# Patient Record
Sex: Female | Born: 1935 | Race: Black or African American | Hispanic: No | Smoking: Never smoker
Health system: Southern US, Community
[De-identification: ages and names within clinical notes are randomized; demographics above are authoritative.]

## PROBLEM LIST (undated history)

## (undated) DIAGNOSIS — T7840XA Allergy, unspecified, initial encounter: Secondary | ICD-10-CM

## (undated) HISTORY — DX: Allergy, unspecified, initial encounter: T78.40XA

---

## 2006-06-22 ENCOUNTER — Encounter: Payer: Self-pay | Admitting: Internal Medicine

## 2006-06-27 ENCOUNTER — Encounter: Payer: Self-pay | Admitting: Internal Medicine

## 2006-07-11 ENCOUNTER — Encounter: Payer: Self-pay | Admitting: Internal Medicine

## 2007-07-03 ENCOUNTER — Encounter: Payer: Self-pay | Admitting: Internal Medicine

## 2008-08-03 ENCOUNTER — Emergency Department (HOSPITAL_COMMUNITY): Admission: EM | Admit: 2008-08-03 | Discharge: 2008-08-03 | Payer: Self-pay | Admitting: Emergency Medicine

## 2008-08-03 LAB — CONVERTED CEMR LAB
Eosinophils Relative: 7 %
HCT: 36.2 %
Hemoglobin: 12.3 g/dL
MCV: 101.1 fL
Monocytes Relative: 9 %
RBC: 3.58 M/uL

## 2008-08-19 ENCOUNTER — Ambulatory Visit: Payer: Self-pay | Admitting: Internal Medicine

## 2008-08-19 DIAGNOSIS — G43909 Migraine, unspecified, not intractable, without status migrainosus: Secondary | ICD-10-CM | POA: Insufficient documentation

## 2008-08-19 DIAGNOSIS — D61818 Other pancytopenia: Secondary | ICD-10-CM | POA: Insufficient documentation

## 2008-08-19 DIAGNOSIS — L84 Corns and callosities: Secondary | ICD-10-CM | POA: Insufficient documentation

## 2008-08-19 DIAGNOSIS — E785 Hyperlipidemia, unspecified: Secondary | ICD-10-CM

## 2008-08-19 DIAGNOSIS — Z87448 Personal history of other diseases of urinary system: Secondary | ICD-10-CM | POA: Insufficient documentation

## 2008-08-19 DIAGNOSIS — R51 Headache: Secondary | ICD-10-CM

## 2008-08-19 DIAGNOSIS — D649 Anemia, unspecified: Secondary | ICD-10-CM | POA: Insufficient documentation

## 2008-08-19 DIAGNOSIS — J309 Allergic rhinitis, unspecified: Secondary | ICD-10-CM | POA: Insufficient documentation

## 2008-08-19 DIAGNOSIS — R519 Headache, unspecified: Secondary | ICD-10-CM | POA: Insufficient documentation

## 2009-05-17 ENCOUNTER — Encounter: Payer: Self-pay | Admitting: Internal Medicine

## 2009-10-22 ENCOUNTER — Ambulatory Visit: Payer: Self-pay | Admitting: Internal Medicine

## 2009-10-22 DIAGNOSIS — R350 Frequency of micturition: Secondary | ICD-10-CM | POA: Insufficient documentation

## 2009-10-22 LAB — CONVERTED CEMR LAB
Glucose, Urine, Semiquant: NEGATIVE
Ketones, urine, test strip: NEGATIVE
Protein, U semiquant: NEGATIVE
Specific Gravity, Urine: 1.025
pH: 5

## 2010-04-25 ENCOUNTER — Emergency Department (HOSPITAL_COMMUNITY)
Admission: EM | Admit: 2010-04-25 | Discharge: 2010-04-25 | Payer: Self-pay | Source: Home / Self Care | Admitting: Emergency Medicine

## 2010-04-26 NOTE — Assessment & Plan Note (Signed)
Summary: FREQUENCY---STC   Vital Signs:  Patient profile:   77 year old female Height:      64 inches (162.56 cm) Weight:      197 pounds (89.55 kg) BMI:     33.94 O2 Sat:      97 % on Room air Temp:     98.4 degrees F (36.89 degrees C) oral Pulse rate:   63 / minute BP sitting:   110 / 60  (left arm) Cuff size:   large  Vitals Entered By: Orlan Leavens RMA (October 22, 2009 11:12 AM)  O2 Flow:  Room air CC: Urine frequency, evaluation of UTI's Is Patient Diabetic? No Pain Assessment Patient in pain? no        Primary Care Provider:  Newt Lukes MD  CC:  Urine frequency and evaluation of UTI's.  History of Present Illness:       This is a 77 year old female who presents for evaluation of UTI's.  The onset of the problem began 3 days ago.  The severity is described as moderate.  The patient is here today for evaluation and treatment of initial UTI symptoms.  The patient is not currently sexually active and is not pregnant.  Risk factors for UTI's include postmenopausal.  There is no history of calculus disease, urethral stricture, immunocompromised, and diabetes.  Prior evaluation has included no evaluation to date.  Prior treatment has included no prior treatment to date.    Clinical Review Panels:  CBC   WBC:  3.5 (08/03/2008)   RBC:  3.58 (08/03/2008)   Hgb:  12.3 (08/03/2008)   Hct:  36.2 (08/03/2008)   Platelets:  135 (08/03/2008)   MCV  101.1 (08/03/2008)   RDW  13.8 (08/03/2008)   PMN:  53 (08/03/2008)   Monos:  9 (08/03/2008)   Eosinophils:  7 (08/03/2008)   Basophil:  1 (08/03/2008)   Current Medications (verified): 1)  Multivitamins  Tabs (Multiple Vitamin) .... Take 1 By Mouth Once Daily  Allergies (verified): 1)  ! Sulfa  Past History:  Past Medical History: Allergic rhinitis mild pancytopenia- s/p heme eval 3/08: mild IgG prot band but neg BM bx/cytogenetics Anemia-NOS - gastric erosions on EGD/neg colo 4/08 Hyperlipidemia  hx H. pylori -  per gastric bx 4/08  Social History: Never Smoked widowed, single retired from [employer], former sp. ed Runner, broadcasting/film/video moved back to [place] area from [place] 02/14/2023 following spouse's death  Review of Systems  The patient denies weight loss, headaches, abdominal pain, hematuria, and incontinence.    Physical Exam  General:  overweight-appearing.  but alert, well-developed, well-nourished, and cooperative to examination.    Lungs:  normal respiratory effort, no intercostal retractions or use of accessory muscles; normal breath sounds bilaterally - no crackles and no wheezes.    Heart:  normal rate, regular rhythm, no murmur, and no rub. BLE without edema. normal DP pulses and normal cap refill in all 4 extremities    Abdomen:  soft, non-tender, normal bowel sounds, no distention; no masses and no appreciable hepatomegaly or splenomegaly.     Impression & Recommendations:  Problem # 1:  URINARY FREQUENCY (ICD-788.41)  +Udip - send for Ucx and tx 5 d cipro - Orders: UA Dipstick w/o Micro (manual) ([address]109) T-Culture, Urine ([id number]) Prescription Created Electronically [id number])  Discussed use of medication.   Complete Medication List: 1)  Multivitamins Tabs (Multiple vitamin) .... Take 1 by mouth once daily 2)  Ciprofloxacin Hcl 250 Mg Tabs (Ciprofloxacin  hcl) .... 1 by mouth two times a day  Patient Instructions: 1)  it was good to see you today. 2)  cipro for your bladder infection - your prescriptions have been electronically submitted to your pharmacy. Please take as directed. Contact our office if you believe you're having problems with the medication(s).  3)  Get plenty of rest, drink lots of clear liquids, and use Tylenol or Ibuprofen for fever and comfort. Return in 7-10 days if you're not better:sooner if you're feeling worse. 4)  Please schedule a follow-up appointment in 6 months, sooner if problems. will check labs including cholesterol at your next  visit Prescriptions: CIPROFLOXACIN HCL 250 MG TABS (CIPROFLOXACIN HCL) 1 by mouth two times a day  #10 x 0   Entered and Authorized by:   Newt Lukes MD   Signed by:   Newt Lukes MD on 10/22/2009   Method used:   Electronically to        [facility]  [address] [id number] (retail)       [address]       [place], [place]  [address]       Ph: [phone] or [phone]       Fax: [phone]   RxID:   [id number]   Laboratory Results   Urine Tests    Routine Urinalysis   Color: lt. yellow Appearance: Clear Glucose: negative   (Normal Range: Negative) Bilirubin: negative   (Normal Range: Negative) Ketone: negative   (Normal Range: Negative) Spec. Gravity: 1.025   (Normal Range: 1.003-1.035) Blood: negative   (Normal Range: Negative) pH: 5.0   (Normal Range: 5.0-8.0) Protein: negative   (Normal Range: Negative) Urobilinogen: 0.2   (Normal Range: 0-1) Nitrite: negative   (Normal Range: Negative) Leukocyte Esterace: small   (Normal Range: Negative)

## 2010-04-26 NOTE — Letter (Signed)
Summary: [facility]   Imported By: Sherian Rein 05/20/2009 13:04:53  _____________________________________________________________________  External Attachment:    Type:   Image     Comment:   External Document

## 2010-07-05 LAB — DIFFERENTIAL
Lymphocytes Relative: 31 % (ref 12–46)
Lymphs Abs: 1.1 10*3/uL (ref 0.7–4.0)
Monocytes Relative: 9 % (ref 3–12)
Neutro Abs: 1.9 10*3/uL (ref 1.7–7.7)
Neutrophils Relative %: 53 % (ref 43–77)

## 2010-07-05 LAB — CBC
RBC: 3.58 MIL/uL — ABNORMAL LOW (ref 3.87–5.11)
WBC: 3.5 10*3/uL — ABNORMAL LOW (ref 4.0–10.5)

## 2010-07-05 LAB — BASIC METABOLIC PANEL
Calcium: 9.2 mg/dL (ref 8.4–10.5)
Creatinine, Ser: 0.82 mg/dL (ref 0.4–1.2)
GFR calc Af Amer: >60 mL/min (ref >60)

## 2011-04-19 DIAGNOSIS — M129 Arthropathy, unspecified: Secondary | ICD-10-CM | POA: Insufficient documentation

## 2011-04-19 DIAGNOSIS — M6530 Trigger finger, unspecified finger: Secondary | ICD-10-CM | POA: Insufficient documentation

## 2012-12-07 ENCOUNTER — Encounter (HOSPITAL_COMMUNITY): Payer: Self-pay | Admitting: Emergency Medicine

## 2012-12-07 ENCOUNTER — Emergency Department (INDEPENDENT_AMBULATORY_CARE_PROVIDER_SITE_OTHER)
Admission: EM | Admit: 2012-12-07 | Discharge: 2012-12-07 | Disposition: A | Discharge status: Home / Self Care | Payer: Medicare Other | Source: Home / Self Care

## 2012-12-07 DIAGNOSIS — M79609 Pain in unspecified limb: Secondary | ICD-10-CM

## 2012-12-07 DIAGNOSIS — M79605 Pain in left leg: Secondary | ICD-10-CM

## 2012-12-07 NOTE — ED Provider Notes (Signed)
CSN: [id number]     Arrival date & time 12/07/12  1306 History   First MD Initiated Contact with Patient 12/07/12 1435     Chief Complaint  Patient presents with  . Leg Pain   (Consider location/radiation/quality/duration/timing/severity/associated sxs/prior Treatment) HPI  Care assumed from Gerhard Perches, NP.  Pleasant 79 year old female in generally good health that she was standing in line at the post office and experienced a sudden acute but mild pain in the left posterior calf. She was concerned about a blood clot at that time so she drove from the post office to the urgent care. By the time she arrived she was not having any more pain. Within the last 24 hours she had parked close to another car and while trying to exit the car she believes she may have strained a muscle in her lower left leg. She did not have pain at the time and did not think much else about it. Her only past medical history is that of a arthritis. No medications. No smoking or alcohol use.  History reviewed. No pertinent past medical history. History reviewed. No pertinent past surgical history. No family history on file. History  Substance Use Topics  . Smoking status: Never Smoker   . Smokeless tobacco: Not on file  . Alcohol Use: No   OB History   Grav Para Term Preterm Abortions TAB SAB Ect Mult Living                 Review of Systems Per HPI  Allergies  Sulfonamide derivatives  Home Medications  No current outpatient prescriptions on file. BP 126/46  Pulse 58  Temp(Src) 97.7 F (36.5 C) (Oral)  Resp 17  SpO2 100% Physical Exam On my exam, patient well appearing in NAD Left leg without swelling or tenderness  ED Course  Procedures (including critical care time) Labs Review Labs Reviewed  D-DIMER, QUANTITATIVE  D.dimer 0.32  Imaging Review No results found.  MDM   1. Musculoskeletal leg pain, left    79 yo F with acute onset leg pain, which quickly resolved. D.dimer was negative  for DVT. Patient reassured. - Tylenol prn for pain - Reasons to return for care (either with her PCP at [facility] or [facility]) include persistent pain, swelling, redness - She agrees to return if she worsens. D/c home in stable condition   Hilarie Fredrickson, MD 12/07/12 1546

## 2012-12-07 NOTE — ED Notes (Signed)
Leg pain that started today.  Patient reports sudden onset of pain in calf of leg.  Pain has improved since onset.  Patient reports she was standing still when pain started.

## 2012-12-07 NOTE — ED Notes (Signed)
No instructions.

## 2012-12-07 NOTE — ED Provider Notes (Signed)
CSN: [id number]     Arrival date & time 12/07/12  1306 History   First MD Initiated Contact with Patient 12/07/12 1435     No chief complaint on file.  (Consider location/radiation/quality/duration/timing/severity/associated sxs/prior Treatment) HPI Comments: Pleasant 79 year old female in generally good health that she was standing in line at the post office and experienced a sudden acute but mild pain in the left posterior calf. She was concerned about a blood clot at that time so she drove from the post office to the urgent care. By the time she arrived she was not having any more pain. Within the last 24 hours she had parked close to another car and while trying to exit the car she believes she may have strained a muscle in her lower left leg. She did not have pain at the time and did not think much else about it. Her only past medical history is that of a arthritis. No medications. No smoking or alcohol use.   No past medical history on file. No past surgical history on file. No family history on file. History  Substance Use Topics  . Smoking status: Not on file  . Smokeless tobacco: Not on file  . Alcohol Use: Not on file   OB History   No data available     Review of Systems  Constitutional: Negative for fever, chills and activity change.  HENT: Negative.   Respiratory: Negative.   Cardiovascular: Negative.   Musculoskeletal:       As per HPI  Skin: Negative for color change, pallor and rash.  Neurological: Negative.     Allergies  Sulfonamide derivatives  Home Medications  No current outpatient prescriptions on file. BP 126/46  Pulse 58  Temp(Src) 97.7 F (36.5 C) (Oral)  Resp 17  SpO2 100% Physical Exam  Nursing note and vitals reviewed. Constitutional: She is oriented to person, place, and time. She appears well-developed and well-nourished. No distress.  HENT:  Head: Normocephalic and atraumatic.  Eyes: EOM are normal. Pupils are equal, round, and reactive  to light.  Neck: Normal range of motion. Neck supple.  Musculoskeletal:  There is no discoloration, swelling, tenderness or other abnormalities of the left lower leg. She is able to bear full weight on the left leg but she does walk with a limp. Pedal pulse is one plus. Skin is slightly cool but with good color. Neuromuscular and motor sensory is intact.  Lymphadenopathy:    She has no cervical adenopathy.  Neurological: She is alert and oriented to person, place, and time. No cranial nerve deficit.  Skin: Skin is warm and dry.  Psychiatric: She has a normal mood and affect.    ED Course  Procedures (including critical care time) Labs Review Labs Reviewed - No data to display Imaging Review No results found.  MDM  No diagnosis found.   1455H pt is waiting on a d-dimer. Have reported at shift change to Dr.  Rodman Pickle who will be accepting care from this point. The pt is stable  And has no complaints at this time.    Hayden Rasmussen, NP 12/08/12 [phone]

## 2012-12-08 NOTE — ED Provider Notes (Signed)
Medical screening examination/treatment/procedure(s) were performed by a resident physician or non-physician practitioner and as the supervising physician I was immediately available for consultation/collaboration.  Clementeen Graham, MD    Rodolph Bong, MD 12/08/12 [phone]

## 2012-12-09 NOTE — ED Provider Notes (Signed)
Medical screening examination/treatment/procedure(s) were performed by a resident physician or non-physician practitioner and as the supervising physician I was immediately available for consultation/collaboration.  Zyionna Pesce, MD    Anayia Eugene S Frederic Tones, MD 12/09/12 0758 

## 2013-01-17 ENCOUNTER — Ambulatory Visit (INDEPENDENT_AMBULATORY_CARE_PROVIDER_SITE_OTHER): Payer: MEDICARE

## 2013-01-17 VITALS — BP 132/66 | HR 63 | Resp 12 | Ht 63.0 in | Wt 180.0 lb

## 2013-01-17 DIAGNOSIS — L97509 Non-pressure chronic ulcer of other part of unspecified foot with unspecified severity: Secondary | ICD-10-CM

## 2013-01-17 DIAGNOSIS — S90121A Contusion of right lesser toe(s) without damage to nail, initial encounter: Secondary | ICD-10-CM

## 2013-01-17 DIAGNOSIS — M2010 Hallux valgus (acquired), unspecified foot: Secondary | ICD-10-CM

## 2013-01-17 DIAGNOSIS — M2040 Other hammer toe(s) (acquired), unspecified foot: Secondary | ICD-10-CM

## 2013-01-17 DIAGNOSIS — L02619 Cutaneous abscess of unspecified foot: Secondary | ICD-10-CM

## 2013-01-17 DIAGNOSIS — R52 Pain, unspecified: Secondary | ICD-10-CM

## 2013-01-17 DIAGNOSIS — M2041 Other hammer toe(s) (acquired), right foot: Secondary | ICD-10-CM

## 2013-01-17 DIAGNOSIS — M2011 Hallux valgus (acquired), right foot: Secondary | ICD-10-CM

## 2013-01-17 MED ORDER — CEPHALEXIN 500 MG PO CAPS: 500.0000 mg | ORAL_CAPSULE | Freq: Three times a day (TID) | ORAL | Status: DC

## 2013-01-17 NOTE — Patient Instructions (Signed)

## 2013-01-17 NOTE — Progress Notes (Signed)
Subjective:    Patient ID: Monique Young, female    DOB: 1931-03-24, 79 y.o.   MRN: [id number]  HPI Comments: "I stumped these toes and now they are so sore"  2nd and 3rd toes right  Toe Pain  The incident occurred more than 1 week ago (approx 1 week ago). The incident occurred at home (getting into the shower). The injury mechanism was a direct blow. The pain is present in the right toes. The quality of the pain is described as aching. The pain is at a severity of 9/10. The pain is moderate. The pain has been constant since onset. Associated symptoms include an inability to bear weight. Associated symptoms comments: 3rd toe is callused at the tip. She reports no foreign bodies present. The symptoms are aggravated by movement, weight bearing and palpation. Treatments tried: soaked in warm epsom salt water. The treatment provided mild relief.   patient was last seen for the similar problem back in June does have notable bunion and hammertoe deformity and had aggravation or corn medial surface of second digit at that time. At this time indicates getting into the shower she bumped her toes and is having pain in particular second toe again. There is edema and erythema with keratosis medial surface of the second, as well as distal clavus third digit right foot.    Review of Systems  All other systems reviewed and are negative.       Objective:   Physical Exam  Constitutional: She is oriented to person, place, and time. She appears well-developed and well-nourished.  Cardiovascular:  Pulses:      Dorsalis pedis pulses are 2+ on the right side, and 2+ on the left side.       Posterior tibial pulses are 1+ on the right side, and 1+ on the left side.  Capillary fill time 3 seconds all digits. Skin temperature warm turgor normal. Mild varicosities noted bilateral. No edema noted.  Musculoskeletal:  Orthopedic biomechanical exam reveals rectus foot type with mild to moderate bunion deformity  right more so than left. There is digital contractures with adductovarus rotated third fourth and fifth second toe hammer toe is dorsally displaced and overlapping the third. The hallux is laterally displaced pushing against the second. There is keratoses in blistered lesion with erythema medial second distal IP joint.  X-rays reveal severe DJD of the rear foot midfoot significant HAV deformity sesamoid position 5 no fractures or osseous abnormality significant osteopenia noted no fractures or digital or metatarsal fractures are identified again dorsal displacement of the second toe MTP level with hammertoe contracture rigid  Neurological: She is alert and oriented to person, place, and time. She has normal strength.  Epicritic and proprioceptive sensations intact and symmetric bilateral. Normal plantar response DTRs not elicited to  Skin: Skin is warm and dry. No cyanosis. Nails show no clubbing.  Skin color pigment normal bilateral hair growth absent bilateral. There is keratosis distal clavus third digit right foot. There is also keratoses medial distal IP joint second digit right foot do irritation from the laterally deviated hallux the keratoses shows an erythematous base with a blisterlike ulceration. On debridement  Psychiatric: She has a normal mood and affect. Her behavior is normal.          Assessment & Plan:  Degenerative arthritic changes and HAV deformity and hammertoe deformity with subsequent ulceration or pre-also keratoses second digit right foot. Likely localized cellulitis no ascending cellulitis lymphangitis is identified at this time the  keratotic lesion is debrided down to a slight pinpoint serous drainage. Neosporin and Band-Aid dressing are applied. Tube foam padding is also dispensed and applied discuss the possibility of surgical intervention the future possibly a digital amputation would be the simplest and easiest procedure for patient to undergo.. At this time patient is  given a prescription for cephalexin 500 mg 3 times a day x10 days and recommended soap and water cleansing a Neosporin and Band-Aid dressing daily recheck in 2-3 weeks for followup and further discussion of all options.  Alvan Dame DPM

## 2013-02-07 ENCOUNTER — Ambulatory Visit (INDEPENDENT_AMBULATORY_CARE_PROVIDER_SITE_OTHER): Payer: BC Managed Care – PPO

## 2013-02-07 VITALS — BP 139/64 | HR 61 | Temp 98.1°F | Resp 24

## 2013-02-07 DIAGNOSIS — M2040 Other hammer toe(s) (acquired), unspecified foot: Secondary | ICD-10-CM

## 2013-02-07 DIAGNOSIS — M2010 Hallux valgus (acquired), unspecified foot: Secondary | ICD-10-CM

## 2013-02-07 DIAGNOSIS — L97509 Non-pressure chronic ulcer of other part of unspecified foot with unspecified severity: Secondary | ICD-10-CM

## 2013-02-07 DIAGNOSIS — M2041 Other hammer toe(s) (acquired), right foot: Secondary | ICD-10-CM

## 2013-02-07 DIAGNOSIS — M2011 Hallux valgus (acquired), right foot: Secondary | ICD-10-CM

## 2013-02-07 NOTE — Progress Notes (Signed)
  Subjective:    Patient ID: Trudie Buckler, female    DOB: 01/29/1931, 79 y.o.   MRN: [id number] "I think it's doing okay, it's still sore."  HPI patient does have a healed ulcer second third toes the lateral are aspect of the hallux and second toes right foot adjacent to each other the ulcerations resolved distal clavus third is also result of keratoses    Review of Systems no changes     Objective:   Physical Exam Neurovascular status intact and unchanged pedal pulses palpable DP postal for PT pulse one over 4 capillary refill time 3 seconds orthopedic exam reveals notable bunion and hammertoe deformities with digital contractures associated keratoses the ulcer is resolved no discharge or drainage is noted however dispensed at this time some tube foam padding to keep toes separated patient would be candidate for possible surgical intervention consider either simple toe amputation versus a reconstruction with bunion and hammertoe repairs the other alternative is continued accommodative shoe gear and padding to keep the toes separated       Assessment & Plan:  Assessment resolved ulceration secondary digital deformity and arthropathy. Cellulitis and infection has resolved no discharge or drainage noted current time some additional tube foam padding is dispensed and discussed permit followup on an as-needed basis if there's any re\re exacerbation or recurrence or decision to pursue other invasive alternatives. Followup for conservative care and as-needed basis for either debridement or additional padding as needed  Alvan Dame DPM

## 2013-02-07 NOTE — Patient Instructions (Signed)
Corns and Calluses Corns are small areas of thickened skin that usually occur on the top, sides, or tip of a toe. They contain a cone-shaped core with a point that can press on a nerve below. This causes pain. Calluses are areas of thickened skin that usually develop on hands, fingers, palms, soles of the feet, and heels. These are areas that experience frequent friction or pressure. CAUSES  Corns are usually the result of rubbing (friction) or pressure from shoes that are too tight or do not fit properly. Calluses are caused by repeated friction and pressure on the affected areas. SYMPTOMS  A hard growth on the skin.  Pain or tenderness under the skin.  Sometimes, redness and swelling.  Increased discomfort while wearing tight-fitting shoes. DIAGNOSIS  Your caregiver can usually tell what the problem is by doing a physical exam. TREATMENT  Removing the cause of the friction or pressure is usually the only treatment needed. However, sometimes medicines can be used to help soften the hardened, thickened areas. These medicines include salicylic acid plasters and 12% ammonium lactate lotion. These medicines should only be used under the direction of your caregiver. HOME CARE INSTRUCTIONS   Try to remove pressure from the affected area.  You may wear donut-shaped corn pads to protect your skin.  The corns and calluses of her toes will likely be recurring do to the deformities including your bunion and hammertoe deformities. Options for treatment at that time are to continue utilizing cushioned pads and toe separators. The other alternative is surgical correction. Options for surgery would include #1 a simple amputation of the second toe. Or #2 surgical repair of bunion and repair of hammertoe deformities, which would require pin fixations and a prolonged recovery. Both can be done an outpatient surgery at day surgery Center.  You may use a pumice stone or nonmetallic nail file to gently reduce  the thickness of a corn.  Wear properly fitted footwear.  If you have calluses on the hands, wear gloves during activities that cause friction.  If you have diabetes, you should regularly examine your feet. Tell your caregiver if you notice any problems with your feet. SEEK IMMEDIATE MEDICAL CARE IF:   You have increased pain, swelling, redness, or warmth in the affected area.  Your corn or callus starts to drain fluid or bleeds.  You are not getting better, even with treatment. Document Released: 12/18/2003 Document Revised: 06/05/2011 Document Reviewed: 11/08/2010 [facility] Patient Information 2014 [place], [place].

## 2013-05-02 DIAGNOSIS — E669 Obesity, unspecified: Secondary | ICD-10-CM | POA: Insufficient documentation

## 2013-05-02 DIAGNOSIS — Z136 Encounter for screening for cardiovascular disorders: Secondary | ICD-10-CM | POA: Insufficient documentation

## 2013-08-17 ENCOUNTER — Ambulatory Visit (INDEPENDENT_AMBULATORY_CARE_PROVIDER_SITE_OTHER): Payer: MEDICARE | Admitting: Emergency Medicine

## 2013-08-17 ENCOUNTER — Ambulatory Visit: Payer: MEDICARE

## 2013-08-17 VITALS — BP 120/70 | HR 68 | Temp 98.2°F | Resp 18 | Wt 186.0 lb

## 2013-08-17 DIAGNOSIS — M79609 Pain in unspecified limb: Secondary | ICD-10-CM

## 2013-08-17 DIAGNOSIS — M7989 Other specified soft tissue disorders: Secondary | ICD-10-CM

## 2013-08-17 DIAGNOSIS — M79642 Pain in left hand: Secondary | ICD-10-CM

## 2013-08-17 DIAGNOSIS — D619 Aplastic anemia, unspecified: Secondary | ICD-10-CM

## 2013-08-17 DIAGNOSIS — D539 Nutritional anemia, unspecified: Secondary | ICD-10-CM

## 2013-08-17 LAB — POCT CBC
GRANULOCYTE PERCENT: 54.6 % (ref 37–80)
HEMATOCRIT: 33.5 % — AB (ref 37.7–47.9)
Hemoglobin: 10.4 g/dL — AB (ref 12.2–16.2)
Lymph, poc: 1.4 (ref 0.6–3.4)
MCH, POC: 31.7 pg — AB (ref 27–31.2)
MCHC: 31 g/dL — AB (ref 31.8–35.4)
MCV: 102.2 fL — AB (ref 80–97)
MID (cbc): 0.4 (ref 0–0.9)
MPV: 8.9 fL (ref 0–99.8)
POC GRANULOCYTE: 2.1 (ref 2–6.9)
POC LYMPH %: 36.1 % (ref 10–50)
POC MID %: 9.3 %M (ref 0–12)
Platelet Count, POC: 125 10*3/uL — AB (ref 142–424)
RBC: 3.28 M/uL — AB (ref 4.04–5.48)
RDW, POC: 14.2 %
WBC: 3.9 10*3/uL — AB (ref 4.6–10.2)

## 2013-08-17 LAB — URIC ACID: URIC ACID, SERUM: 5.7 mg/dL (ref 2.4–7.0)

## 2013-08-17 LAB — POCT SEDIMENTATION RATE: POCT SED RATE: 55 mm/h — AB (ref 0–22)

## 2013-08-17 LAB — VITAMIN B12: VITAMIN B 12: 760 pg/mL (ref 211–911)

## 2013-08-17 MED ORDER — CEPHALEXIN 500 MG PO CAPS: 500.0000 mg | ORAL_CAPSULE | Freq: Two times a day (BID) | ORAL | Status: DC

## 2013-08-17 MED ORDER — PREDNISONE 10 MG PO TABS: ORAL_TABLET | ORAL | Status: DC

## 2013-08-17 NOTE — Patient Instructions (Signed)

## 2013-08-17 NOTE — Progress Notes (Signed)
   Subjective:    Patient ID: Monique Young, female    DOB: Dec 24, 1930, 80 y.o.   MRN: [id number]  HPI patient states she was fine until last night in the early evening she noticed pain and swelling on the dorsum of the left hand. Everything different yesterday was the patient went and purchased some strawberries which she ate last night. She denies trauma to her hand. She has no history of gout. She talked to her son who recommended she try Benadryl without improvement. Today she has noted persistent pain and increasing swelling.    Review of Systems patient has been in remarkably good health. She does not currently take any medication a regular basis.     Objective:   Physical Exam patient is alert and cooperative she is not in any distress. Examination of the left hand reveals a 7.5 x 7.5 area of swelling over the dorsum of the hand. There is exquisite tenderness over the left third MCP joint there is severe pain with movement of that finger. The area of tenderness does appear indurated.        Results for orders placed in visit on 08/17/13  POCT CBC      Result Value Ref Range   WBC 3.9 (*) 4.6 - 10.2 K/uL   Lymph, poc 1.4  0.6 - 3.4   POC LYMPH PERCENT 36.1  10 - 50 %L   MID (cbc) 0.4  0 - 0.9   POC MID % 9.3  0 - 12 %M   POC Granulocyte 2.1  2 - 6.9   Granulocyte percent 54.6  37 - 80 %G   RBC 3.28 (*) 4.04 - 5.48 M/uL   Hemoglobin 10.4 (*) 12.2 - 16.2 g/dL   HCT, POC 73.5 (*) 78.9 - 47.9 %   MCV 102.2 (*) 80 - 97 fL   MCH, POC 31.7 (*) 27 - 31.2 pg   MCHC 31.0 (*) 31.8 - 35.4 g/dL   RDW, POC 78.4     Platelet Count, POC 125 (*) 142 - 424 K/uL   MPV 8.9  0 - 99.8 fL  [facility] reading (PRIMARY) by  Dr.Quantavius Humm there is a 1 x 1 cm calcific density over the dorsum of the distal third metacarpal. Question whether this is a gouty tophi   Assessment & Plan:  Not clear what the source of swelling in his. This could be an abscess developing in that area though she has no history of  trauma. This could also be gout which would be more likely in absence of fever or trauma. Hemoglobin slightly low with a MCV 102 check a B12 level. White count is 3900 which would be against an infection. I suspect this is going to turn out to be gout. We'll treat with prednisone taper along with cephalexin pending uric acid results.

## 2013-08-19 ENCOUNTER — Other Ambulatory Visit: Payer: Self-pay | Admitting: *Deleted

## 2013-08-19 DIAGNOSIS — L03119 Cellulitis of unspecified part of limb: Principal | ICD-10-CM

## 2013-08-19 DIAGNOSIS — L02519 Cutaneous abscess of unspecified hand: Secondary | ICD-10-CM

## 2013-08-19 MED ORDER — CEPHALEXIN 500 MG PO CAPS: 500.0000 mg | ORAL_CAPSULE | Freq: Two times a day (BID) | ORAL | Status: DC

## 2013-09-17 ENCOUNTER — Ambulatory Visit (INDEPENDENT_AMBULATORY_CARE_PROVIDER_SITE_OTHER): Payer: MEDICARE | Admitting: Podiatry

## 2013-09-17 ENCOUNTER — Encounter: Payer: Self-pay | Admitting: Podiatry

## 2013-09-17 ENCOUNTER — Ambulatory Visit: Payer: MEDICARE | Admitting: Podiatry

## 2013-09-17 VITALS — BP 136/96 | HR 62 | Ht 64.0 in | Wt 185.0 lb

## 2013-09-17 DIAGNOSIS — M79606 Pain in leg, unspecified: Secondary | ICD-10-CM | POA: Insufficient documentation

## 2013-09-17 DIAGNOSIS — L97509 Non-pressure chronic ulcer of other part of unspecified foot with unspecified severity: Secondary | ICD-10-CM | POA: Insufficient documentation

## 2013-09-17 DIAGNOSIS — M2040 Other hammer toe(s) (acquired), unspecified foot: Secondary | ICD-10-CM | POA: Insufficient documentation

## 2013-09-17 DIAGNOSIS — B351 Tinea unguium: Secondary | ICD-10-CM | POA: Insufficient documentation

## 2013-09-17 DIAGNOSIS — M79604 Pain in right leg: Secondary | ICD-10-CM

## 2013-09-17 DIAGNOSIS — M79609 Pain in unspecified limb: Secondary | ICD-10-CM

## 2013-09-17 NOTE — Progress Notes (Signed)
Subjective: 80 year old female presents stating that she has been having painful corn over a year on 2nd digit right and is getting much worse with more pain. Concerned with contracted hammer toes on right foot and seeking a remedy.   Review of Systems - General ROS: negative for - fatigue, fever, night sweats, sleep disturbance, weight gain or weight loss Ophthalmic ROS: negative ENT ROS: Allergies bothers her ear.  Allergy and Immunology ROS: Allergy with seasonal weather. Breast ROS: negative for breast lumps Respiratory ROS: no cough, shortness of breath, or wheezing Cardiovascular ROS: no chest pain or dyspnea on exertion Gastrointestinal ROS: Takes medication for constipation.  Genito-Urinary ROS: no dysuria, trouble voiding, or hematuria Musculoskeletal ROS: negative Neurological ROS: no TIA or stroke symptoms Dermatological ROS: Dry skin in heels.  Objective: Dermatologic: Ulcerating corn 2nd digit DIPJ medial aspect right foot. All nails are hypertrophic. Vascular: Dorsalis pedis arteries are faintly palpable. Posterior tibial pulses are not palpable.  Positive of mild ankle edema bilateral. Neurologic: Fail to respond to Monofilament sensory testing. Response to Vibratory sensation test is normal. 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 Contracted lesser digits 2-4 bilateral with overlapping 1st and 2nd digit with digital ulcer right foot.  Assessment: Ulcerating digital corn 2nd right. Severe digital contracture R>L.  Pain in right foot. Mycotic nails x 10.   Plan: Lesion debrided and padded with Amerigel with instruction. All nails debrided. Return in 2 weeks.

## 2013-09-17 NOTE — Patient Instructions (Signed)
Seen for ulcerating corn 2nd toe right. Debrided and padded. Keep the pad for one week. Return in 2 weeks.

## 2013-10-01 ENCOUNTER — Ambulatory Visit (INDEPENDENT_AMBULATORY_CARE_PROVIDER_SITE_OTHER): Payer: MEDICARE | Admitting: Podiatry

## 2013-10-01 ENCOUNTER — Encounter: Payer: Self-pay | Admitting: Podiatry

## 2013-10-01 VITALS — BP 132/72 | HR 73 | Ht 64.0 in | Wt 185.0 lb

## 2013-10-01 DIAGNOSIS — M79609 Pain in unspecified limb: Secondary | ICD-10-CM

## 2013-10-01 DIAGNOSIS — M2040 Other hammer toe(s) (acquired), unspecified foot: Secondary | ICD-10-CM

## 2013-10-01 DIAGNOSIS — M21611 Bunion of right foot: Secondary | ICD-10-CM

## 2013-10-01 DIAGNOSIS — M21619 Bunion of unspecified foot: Secondary | ICD-10-CM | POA: Insufficient documentation

## 2013-10-01 DIAGNOSIS — M79604 Pain in right leg: Secondary | ICD-10-CM

## 2013-10-01 DIAGNOSIS — M2011 Hallux valgus (acquired), right foot: Secondary | ICD-10-CM

## 2013-10-01 DIAGNOSIS — M2010 Hallux valgus (acquired), unspecified foot: Secondary | ICD-10-CM

## 2013-10-01 NOTE — Progress Notes (Signed)
Subjective: 2 weeks follow up on right 2nd digit pre ulcerative lesion. Patient stated that she was having too much trouble with the 2nd toe and bunion. Stated that she has had discussed about her foot condition with her family member and wants to have the bunion and hammer toe surgery.   Review of Systems: General ROS: negative for - fatigue, fever, night sweats, sleep disturbance, weight gain or weight loss  Ophthalmic ROS: negative  ENT ROS: Allergies bothers her ear.  Allergy and Immunology ROS: Allergy with seasonal weather.  Breast ROS: negative for breast lumps  Respiratory ROS: no cough, shortness of breath, or wheezing  Cardiovascular ROS: no chest pain or dyspnea on exertion  Gastrointestinal ROS: Takes medication for constipation.  Genito-Urinary ROS: no dysuria, trouble voiding, or hematuria  Musculoskeletal ROS: negative  Neurological ROS: no TIA or stroke symptoms  Dermatological ROS: Dry skin in heels.   Objective:  Dermatologic:  Keratotic lesion 2nd digit DIPJ medial aspect right foot without skin breakdown.  All nails are hypertrophic.  Vascular: Right foot DP is palpable, on left is faintly palpable. Posterior tibial pulses are not palpable.  Positive of mild ankle edema bilateral.  Neurologic: Fail to respond to Monofilament sensory testing. Response to Vibratory sensation test is normal.  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  Contracted lesser digits 2-4 bilateral with overlapping 1st and 2nd digit with digital ulcer right foot.  Overlapping first and 2nd digit right.  Radiographic examination was done.   Assessment: Painful corn 2nd right, chronic.  Severe digital contracture R>L.  Hallux valgus with bunion right. Pain in right foot.  Mycotic nails x 10.   Plan: Lesion debrided and Gel toe spreader placed in the first web space.  Patient wants to have the problem toes corrected at this time since she has had too much trouble with the 2nd toe and bunion.   Surgery [facility] and Hammer toe repair 2nd right.

## 2013-10-01 NOTE — Patient Instructions (Signed)
Pre ulcerative lesion healed. As per request, pre op consultation and paper work done.

## 2013-10-23 DIAGNOSIS — M2040 Other hammer toe(s) (acquired), unspecified foot: Secondary | ICD-10-CM

## 2013-10-23 DIAGNOSIS — M2010 Hallux valgus (acquired), unspecified foot: Secondary | ICD-10-CM

## 2013-10-23 HISTORY — PX: OTHER SURGICAL HISTORY: SHX169

## 2013-10-23 HISTORY — PX: BUNIONECTOMY: SHX129

## 2013-10-28 ENCOUNTER — Ambulatory Visit (INDEPENDENT_AMBULATORY_CARE_PROVIDER_SITE_OTHER): Payer: MEDICARE | Admitting: Podiatry

## 2013-10-28 ENCOUNTER — Encounter: Payer: Self-pay | Admitting: Podiatry

## 2013-10-28 VITALS — BP 145/71 | HR 62

## 2013-10-28 DIAGNOSIS — Z9889 Other specified postprocedural states: Secondary | ICD-10-CM

## 2013-10-28 NOTE — Patient Instructions (Signed)
1 week Post op check right foot following bunion and hammer toe surgery. Wound healing is good with dry wound and minimum edema. Keep the bandage intact and area dry. Return in one week.

## 2013-10-28 NOTE — Progress Notes (Signed)
1 week post op following McBride and Hammer toe surgery 2nd right. Stated that she had minimum pain. She is not taking Anti inflammatory medication because it is upsetting her stomach. Wound is clean and dry. Well coapted with minimum edema right foot.  Assessment: Satisfactory progress.  Plan: Dressing changed. Continue to keep it dry and elevate.  Ambulate in surgical shoes.

## 2013-10-29 ENCOUNTER — Encounter: Payer: Self-pay | Admitting: *Deleted

## 2013-11-04 ENCOUNTER — Encounter: Payer: Self-pay | Admitting: Podiatry

## 2013-11-04 ENCOUNTER — Ambulatory Visit (INDEPENDENT_AMBULATORY_CARE_PROVIDER_SITE_OTHER): Payer: MEDICARE | Admitting: Podiatry

## 2013-11-04 VITALS — BP 141/77 | HR 66

## 2013-11-04 DIAGNOSIS — Z9889 Other specified postprocedural states: Secondary | ICD-10-CM

## 2013-11-04 NOTE — Patient Instructions (Signed)
Post op check. Wound is healing fine. Will remove sutures next week. Return in one week.

## 2013-11-04 NOTE — Progress Notes (Signed)
2 weeks post op following McBride bunionectomy and Hammer toe repair. Clean wound with good skin wound healing noted. No edema or erythema.  Still sensitive at suture site. Dressing changed. Will remove sutures next week.

## 2013-11-11 ENCOUNTER — Encounter: Payer: Self-pay | Admitting: Podiatry

## 2013-11-11 ENCOUNTER — Ambulatory Visit (INDEPENDENT_AMBULATORY_CARE_PROVIDER_SITE_OTHER): Payer: MEDICARE | Admitting: Podiatry

## 2013-11-11 VITALS — BP 139/72 | HR 60

## 2013-11-11 DIAGNOSIS — M21611 Bunion of right foot: Secondary | ICD-10-CM

## 2013-11-11 DIAGNOSIS — M21619 Bunion of unspecified foot: Secondary | ICD-10-CM

## 2013-11-11 NOTE — Progress Notes (Signed)
3 weeks following McBride bunionectomy and hammer toe repair 2nd right. Wound is well approximated and coapted. No edema or erythema noted. All sutures remvoed. Foam gauze dressing dispensed. Return in 2 weeks.

## 2013-11-11 NOTE — Patient Instructions (Signed)
Doing well following bunion surgery. Sutures removed. Return in 2 weeks.

## 2013-11-12 ENCOUNTER — Telehealth: Payer: Self-pay | Admitting: *Deleted

## 2013-11-12 NOTE — Telephone Encounter (Signed)
Dr. Raynald KempSheard, Mrs. Monique Young called at lunch today. She said her foot ad really hurt a lot since she was in the office yesterday. She didn't sleep well last night because of it, she wants to know is this normal and also to confirm that she can take a shower today, she thought you told her that but she don't remember today.

## 2013-11-25 ENCOUNTER — Encounter: Payer: Self-pay | Admitting: Podiatry

## 2013-11-25 ENCOUNTER — Ambulatory Visit (INDEPENDENT_AMBULATORY_CARE_PROVIDER_SITE_OTHER): Payer: MEDICARE | Admitting: Podiatry

## 2013-11-25 VITALS — BP 119/68 | HR 67

## 2013-11-25 DIAGNOSIS — M21611 Bunion of right foot: Secondary | ICD-10-CM

## 2013-11-25 DIAGNOSIS — M21619 Bunion of unspecified foot: Secondary | ICD-10-CM

## 2013-11-25 NOTE — Patient Instructions (Signed)
1 month post op. Doing well but having some stiffness. Need to do cross fiber massage. Return in 1 month.

## 2013-11-25 NOTE — Progress Notes (Signed)
Subjective: 80 year old female presents for 1 month post op visit.   Objective: Status post McBride bunionectomy and hammer toe repair 2nd digit right. No edema or erythema. All incision sites well coapted and healed well.  Assessment: Doing well but having some stiffness along the first ray incision.  Plan: Reviewed findings.  Need to do cross fiber massage. Return in 1 month.

## 2013-12-05 DIAGNOSIS — M79609 Pain in unspecified limb: Secondary | ICD-10-CM

## 2013-12-26 ENCOUNTER — Ambulatory Visit (INDEPENDENT_AMBULATORY_CARE_PROVIDER_SITE_OTHER): Payer: MEDICARE | Admitting: Podiatry

## 2013-12-26 ENCOUNTER — Encounter: Payer: Self-pay | Admitting: Podiatry

## 2013-12-26 DIAGNOSIS — M79606 Pain in leg, unspecified: Secondary | ICD-10-CM

## 2013-12-26 DIAGNOSIS — B351 Tinea unguium: Secondary | ICD-10-CM

## 2013-12-26 NOTE — Patient Instructions (Signed)
Seen for hypertrophic nails and calluses. All nails and calluses debrided. Return in 3 months or as needed.  

## 2013-12-26 NOTE — Progress Notes (Signed)
Subjective:  80 year old female presents for 2 month post op visit right Mcbride bunionectomy and hammer toe repair 2nd. Doing well with the toes without having pain. Also wants to have nails and calluses trimmed.   Objective:  Status post McBride bunionectomy and hammer toe repair 2nd digit right.  Slightly enlarged 2nd toe with contracted 3rd toe under ridding under the 2nd.  No edema or erythema.  All incision sites well coapted and healed well.  All nails are thick and hypertrophic with yellow debris under the big toe. Severe thick callus under the 5th MPJ and at the base of the 5th Metatarsal left foot.   Assessment:  Doing well following bunionectomy and hammer toe repair 2nd right. Has contracted 3rd toe that is going under the 2nd toe right.  Onychomycosis x 10.  Painful callus under 5th MPJ and base right foot.   Plan:  Reviewed findings.  All nails and calluses debrided. Return in 3 months or as needed.

## 2014-03-31 ENCOUNTER — Ambulatory Visit (INDEPENDENT_AMBULATORY_CARE_PROVIDER_SITE_OTHER): Payer: Medicare Other | Admitting: Podiatry

## 2014-03-31 ENCOUNTER — Encounter: Payer: Self-pay | Admitting: Podiatry

## 2014-03-31 VITALS — BP 125/66 | HR 63

## 2014-03-31 DIAGNOSIS — B351 Tinea unguium: Secondary | ICD-10-CM

## 2014-03-31 DIAGNOSIS — M722 Plantar fascial fibromatosis: Secondary | ICD-10-CM | POA: Insufficient documentation

## 2014-03-31 DIAGNOSIS — M79673 Pain in unspecified foot: Secondary | ICD-10-CM

## 2014-03-31 DIAGNOSIS — M79606 Pain in leg, unspecified: Secondary | ICD-10-CM

## 2014-03-31 NOTE — Patient Instructions (Signed)
Debrided all nails and calluses. Tuli's heel cup placed in right heel. In-depth shoe ordered for extra cushion.

## 2014-03-31 NOTE — Progress Notes (Signed)
Subjective:  81 year old female presents for routine foot care. Stated that her right heel is painful.  Also she had done right Mcbride bunionectomy and hammer toe repair 2nd 5 months ago. Doing well with the toes without having pain.   Objective:  Status post McBride bunionectomy and hammer toe repair 2nd digit right, doing well.   All nails are thick and hypertrophic with yellow debris under the big toe. Severe thick callus under the 5th MPJ and at the base of the 5th Metatarsal left foot.  Painful, hyperemic plantar fat pad right heel with decreased fat layer.  Assessment:  Doing well following bunionectomy and hammer toe repair 2nd right. Has contracted 3rd toe with sever digital corn that is going under the 2nd toe right.  Onychomycosis x 10.  Painful callus under 5th MPJ and base right foot.  Plantar fasciitis right.   Plan:  Reviewed findings.  All nails and calluses debrided. Tuli cup placed in right heel. Patient ordered diabetic in-depth shoe for extra cushion on right. Return in 3 months or as needed.

## 2014-10-27 ENCOUNTER — Ambulatory Visit (INDEPENDENT_AMBULATORY_CARE_PROVIDER_SITE_OTHER): Payer: Medicare Other | Admitting: Podiatry

## 2014-10-27 ENCOUNTER — Encounter: Payer: Self-pay | Admitting: Podiatry

## 2014-10-27 VITALS — BP 118/58 | HR 66

## 2014-10-27 DIAGNOSIS — B351 Tinea unguium: Secondary | ICD-10-CM | POA: Diagnosis not present

## 2014-10-27 DIAGNOSIS — M79606 Pain in leg, unspecified: Secondary | ICD-10-CM | POA: Diagnosis not present

## 2014-10-27 NOTE — Progress Notes (Signed)
Subjective:  81 year old female presents for routine foot care.   Objective:  All nails are thick and hypertrophic with yellow debris under the big toe. Severe thick callus under the 5th MPJ and at the base of the 5th Metatarsal left foot.  Painful, hyperemic plantar fat pad right heel with decreased fat layer.  Assessment:  Onychomycosis x 10.  Painful callus under 5th MPJ and base right foot.    Plan:  Reviewed findings.  All nails and calluses debrided. Return in 3 months or as needed.

## 2014-10-27 NOTE — Patient Instructions (Signed)
Seen for hypertrophic nails. All nails debrided. Return in 3 months or as needed.  

## 2014-11-17 ENCOUNTER — Ambulatory Visit
Admission: RE | Admit: 2014-11-17 | Discharge: 2014-11-17 | Disposition: A | Discharge status: Home / Self Care | Payer: Medicare Other | Source: Ambulatory Visit | Attending: Gastroenterology | Admitting: Gastroenterology

## 2014-11-17 ENCOUNTER — Other Ambulatory Visit: Payer: Self-pay | Admitting: Gastroenterology

## 2014-11-17 DIAGNOSIS — K5900 Constipation, unspecified: Secondary | ICD-10-CM

## 2015-01-27 ENCOUNTER — Ambulatory Visit (INDEPENDENT_AMBULATORY_CARE_PROVIDER_SITE_OTHER): Payer: Medicare Other | Admitting: Podiatry

## 2015-01-27 ENCOUNTER — Encounter: Payer: Self-pay | Admitting: Podiatry

## 2015-01-27 VITALS — BP 130/75 | HR 67

## 2015-01-27 DIAGNOSIS — B351 Tinea unguium: Secondary | ICD-10-CM | POA: Diagnosis not present

## 2015-01-27 DIAGNOSIS — M79606 Pain in leg, unspecified: Secondary | ICD-10-CM

## 2015-01-27 NOTE — Progress Notes (Signed)
Subjective:  81 year old female presents requesting toe nails and calluses trimmed.  Patient denies any acute problems.   Objective:  All nails are thick and hypertrophic with yellow debris under the big toe. Severe thick callus under the 5th MPJ and at the base of the 5th Metatarsal left foot.  Painful, hyperemic plantar fat pad right heel with decreased fat layer.  Assessment:  Onychomycosis x 10.  Painful callus under 5th MPJ and base right foot.   Plan:  Reviewed findings.  All nails and calluses debrided. Return in 3 months or as needed.

## 2015-01-27 NOTE — Patient Instructions (Signed)
Seen for hypertrophic nails and calluses. All nails, corns and calluses debrided. Buttress pad placed under 3rd toe right. Return in 3 months or as needed.

## 2015-04-29 ENCOUNTER — Ambulatory Visit (INDEPENDENT_AMBULATORY_CARE_PROVIDER_SITE_OTHER): Payer: Medicare Other | Admitting: Podiatry

## 2015-04-29 ENCOUNTER — Encounter: Payer: Self-pay | Admitting: Podiatry

## 2015-04-29 VITALS — BP 145/72 | HR 62

## 2015-04-29 DIAGNOSIS — M79606 Pain in leg, unspecified: Secondary | ICD-10-CM

## 2015-04-29 DIAGNOSIS — L97519 Non-pressure chronic ulcer of other part of right foot with unspecified severity: Secondary | ICD-10-CM | POA: Insufficient documentation

## 2015-04-29 DIAGNOSIS — M79673 Pain in unspecified foot: Secondary | ICD-10-CM | POA: Diagnosis not present

## 2015-04-29 DIAGNOSIS — B351 Tinea unguium: Secondary | ICD-10-CM

## 2015-04-29 DIAGNOSIS — L97511 Non-pressure chronic ulcer of other part of right foot limited to breakdown of skin: Secondary | ICD-10-CM

## 2015-04-29 NOTE — Progress Notes (Signed)
Subjective:  82 year old female presents requesting toe nails and calluses trimmed.  Had put on callus pad under left foot that has been moved to wrong spot. Right 2nd toe is covered with band due to painful corn in between toes.  Objective:  All nails are thick and hypertrophic with yellow debris under the big toe. Severe thick callus under the 5th MPJ and at the base of the 5th Metatarsal left foot.  Ulcerating corn lateral aspect of 2nd toe right. Severe painful corn at distal end of 3rd toe right. Painful, hyperemic plantar fat pad right heel with decreased fat layer.  Assessment:  Onychomycosis x 10.  Painful callus under 5th MPJ and base right foot.  Ulcerating digital corn 2nd and 3rd digit right.  Plan:  Reviewed findings.  All nails and calluses debrided. Buttress pad placed under 2nd and 3rd digit right. Return in 2 months or sooner if needed. Patient is not to wait too long once pain develops in her foot.

## 2015-04-29 NOTE — Patient Instructions (Signed)
Seen for hypertrophic nails, corns, and calluses. All nails, corns and calluses debrided. Buttress pad placed under 2nd and 3rd right. Return in 2 months or as needed.

## 2015-07-27 ENCOUNTER — Encounter: Payer: Self-pay | Admitting: Podiatry

## 2015-07-27 ENCOUNTER — Ambulatory Visit (INDEPENDENT_AMBULATORY_CARE_PROVIDER_SITE_OTHER): Payer: Medicare Other | Admitting: Podiatry

## 2015-07-27 VITALS — BP 142/62 | HR 62

## 2015-07-27 DIAGNOSIS — B351 Tinea unguium: Secondary | ICD-10-CM | POA: Diagnosis not present

## 2015-07-27 DIAGNOSIS — M79604 Pain in right leg: Secondary | ICD-10-CM | POA: Diagnosis not present

## 2015-07-27 NOTE — Progress Notes (Signed)
Subjective:  82 year old female presents requesting toe nails and calluses trimmed.  Had put on callus pad under left foot that has been moved to wrong spot. Right 2nd toe is covered with band due to painful corn in between toes.  Objective:  All nails are thick and hypertrophic with yellow debris under the big toe. Severe thick callus under the 5th MPJ and at the base of the 5th Metatarsal left foot.  Ulcerating corn lateral aspect of 2nd toe right. Severe painful corn at distal end of 3rd toe right. Painful, hyperemic plantar fat pad right heel with decreased fat layer.  Assessment:  Onychomycosis x 10.  Painful callus under 5th MPJ and base right foot.  Pre ulcerative digital corn 2nd and 3rd digit right.  Plan:  Reviewed findings.  All nails and calluses debrided. Buttress pad placed under 2nd and 3rd digit right. Return in 2 months or sooner if needed. Patient is not to wait too long once pain develops in her foot.

## 2015-07-27 NOTE — Patient Instructions (Signed)
Seen for hypertrophic nails, corns, and calluses. All nails, corns, and calluses debrided. Buttress pad placed under the sulcus 3rd and 4th right foot. Return in 3 months or as needed.

## 2015-10-27 ENCOUNTER — Encounter: Payer: Self-pay | Admitting: Podiatry

## 2015-10-27 ENCOUNTER — Ambulatory Visit (INDEPENDENT_AMBULATORY_CARE_PROVIDER_SITE_OTHER): Payer: Medicare Other | Admitting: Podiatry

## 2015-10-27 DIAGNOSIS — B351 Tinea unguium: Secondary | ICD-10-CM | POA: Diagnosis not present

## 2015-10-27 DIAGNOSIS — M79673 Pain in unspecified foot: Secondary | ICD-10-CM

## 2015-10-27 DIAGNOSIS — M79604 Pain in right leg: Secondary | ICD-10-CM

## 2015-10-27 DIAGNOSIS — M2041 Other hammer toe(s) (acquired), right foot: Secondary | ICD-10-CM

## 2015-10-27 NOTE — Patient Instructions (Signed)
Seen for hypertrophic nails and painful corns and calluses. All nails, corns, and calluses debrided. Return for office procedure on 3rd digit right, Flexor Tenotomy under local anesthetics.

## 2015-10-27 NOTE — Progress Notes (Signed)
Subjective:  82 year old female presents complaining of painful toe 3rd right and requesting toe nails and calluses trimmed. Right 2nd toe is covered with band due to painful corn in between toes.  Objective:  All nails are thick and hypertrophic with yellow debris under the big toe. Severe thick callus under the 5th MPJ and at the base of the 5th Metatarsal left foot.  Severe painful corn at distal end of 3rd toe right. Painful, hyperemic plantar fat pad right heel with decreased fat layer.  Assessment:  Onychomycosis x 10.  Painful callus under 5th MPJ and base right foot.  Pre ulcerative digital corn 2nd and 3rd digit right.  Plan:  Reviewed findings.  All nails and calluses debrided. Reviewed possible Flexor tenotomy on 3rd right to prevent pain and ulceration.  Patient will inform us when she is ready.

## 2016-01-13 DIAGNOSIS — A084 Viral intestinal infection, unspecified: Secondary | ICD-10-CM | POA: Insufficient documentation

## 2016-01-27 ENCOUNTER — Ambulatory Visit: Payer: Medicare Other | Admitting: Podiatry

## 2016-02-29 ENCOUNTER — Ambulatory Visit (INDEPENDENT_AMBULATORY_CARE_PROVIDER_SITE_OTHER): Payer: Medicare Other | Admitting: Podiatry

## 2016-02-29 ENCOUNTER — Encounter: Payer: Self-pay | Admitting: Podiatry

## 2016-02-29 VITALS — BP 143/63 | HR 63

## 2016-02-29 DIAGNOSIS — M79671 Pain in right foot: Secondary | ICD-10-CM | POA: Diagnosis not present

## 2016-02-29 DIAGNOSIS — L97511 Non-pressure chronic ulcer of other part of right foot limited to breakdown of skin: Secondary | ICD-10-CM

## 2016-02-29 DIAGNOSIS — B351 Tinea unguium: Secondary | ICD-10-CM | POA: Diagnosis not present

## 2016-02-29 DIAGNOSIS — M79604 Pain in right leg: Secondary | ICD-10-CM

## 2016-02-29 NOTE — Progress Notes (Signed)
Subjective:  82 year old female presents complaining of painful toe 3rd right and requesting toe nails and calluses trimmed.  She keeps a band aid over 2nd toe to stop friction from the 3rd toe right.  Objective:  All nails are thick and hypertrophic with yellow debris under the big toe. Severe thick callus under the 5th MPJ and at the base of the 5th Metatarsal left foot.  Severe painful corn at distal end of 3rd toe right. Plantar posterior right heel callus.  Overriding 2nd toe on top of the 3rd right foot.  Assessment:  Onychomycosis x 10.  Painful callus under 5th MPJ and base right foot.  Pre ulcerative digital corn 2nd and 3rd digit right.  Plan:  Reviewed findings.  All nails and calluses debrided. Patient wants to return for Flexor tenotomy on 3rd right to prevent pain and ulceration.  Buttress pad placed under the 2nd and 3rd digit right. Patient will inform us when she is ready.

## 2016-02-29 NOTE — Patient Instructions (Signed)
Seen for hypertrophic nails, corns, and calluses. All nails, corns, and calluses debrided. Buttress pad placed under 2nd and 3rd digit right. Return in 3 months or as needed.

## 2016-03-29 ENCOUNTER — Ambulatory Visit (INDEPENDENT_AMBULATORY_CARE_PROVIDER_SITE_OTHER): Payer: Medicare Other | Admitting: Family Medicine

## 2016-03-29 DIAGNOSIS — R51 Headache: Secondary | ICD-10-CM

## 2016-03-29 DIAGNOSIS — R519 Headache, unspecified: Secondary | ICD-10-CM

## 2016-03-29 NOTE — Patient Instructions (Signed)
It was nice meeting you today Ms. Monique Young!  You can continue to take Tylenol for your headaches.   If your headache becomes very severe, if you have changes in your vision, or if you notice numbness or weakness in your arms or legs, please call the clinic or go to the emergency room.   If you have any questions or concerns, please feel free to call the clinic.   Be well,  Dr. Natale MilchLancaster

## 2016-03-29 NOTE — Assessment & Plan Note (Addendum)
Intermittent mild headaches relieved by Tylenol. BP 128/72 in office and patient without history of elevated BPs so unlikely related to hypertension. As no vision changes or neurological deficits, imaging not indicated. Possibly related to recently worsening allergies.  - Continue Tylenol PRN - Educated about warning signs that would necessitate further work-up: changes in vision, severe headache, neuro deficits

## 2016-03-29 NOTE — Progress Notes (Signed)
   Subjective:    Patient ID: Monique Young, female    DOB: 17-Apr-1930, 83 y.o.   MRN: [id number]  HPI  Patient presents for headaches.   Headaches Began 3-4 days ago. Intermittent. Denies changes in vision, numbness, weakness, nausea, vomiting. Headaches are not in any particular area. Patient has been taking Tylenol which improves her symptoms. She reports recently worsening allergies and says she has been eating less over the past few days, which she thinks may be related to her symptoms. She is also concerned that she may have high blood pressure that is causing her headaches, however she has no history of hypertension and is not on any medications for blood pressure. No history of headaches.    Review of Systems Denies changes in vision, numbness, weakness, nausea, vomiting. Endorses headaches.     Objective:   Physical Exam  Constitutional: She appears well-developed and well-nourished. No distress.  HENT:  Head: Normocephalic and atraumatic.  Eyes: Conjunctivae and EOM are normal. Pupils are equal, round, and reactive to light. Right eye exhibits no discharge. Left eye exhibits no discharge.  Pulmonary/Chest: Effort normal. No respiratory distress.  Neurological:  CN II-XII grossly intact. 5/5 strength upper and lower extremities bilaterally. A&Ox3.   Psychiatric: She has a normal mood and affect. Her behavior is normal.      Assessment & Plan:  Headache Intermittent mild headaches relieved by Tylenol. BP 128/72 in office and patient without history of elevated BPs so unlikely related to hypertension. As no vision changes or neurological deficits, imaging not indicated. Possibly related to recently worsening allergies.  - Continue Tylenol PRN - Educated about warning signs that would necessitate further work-up: changes in vision, severe headache, neuro deficits   Tarri AbernethyAbigail J Jamieson Hetland, MD, MPH PGY-2 [facility] Pager [phone]

## 2016-05-29 ENCOUNTER — Ambulatory Visit (INDEPENDENT_AMBULATORY_CARE_PROVIDER_SITE_OTHER): Payer: Medicare Other | Admitting: Podiatry

## 2016-05-29 DIAGNOSIS — L97511 Non-pressure chronic ulcer of other part of right foot limited to breakdown of skin: Secondary | ICD-10-CM

## 2016-05-29 DIAGNOSIS — M79604 Pain in right leg: Secondary | ICD-10-CM

## 2016-05-29 DIAGNOSIS — M79673 Pain in unspecified foot: Secondary | ICD-10-CM | POA: Diagnosis not present

## 2016-05-29 DIAGNOSIS — B351 Tinea unguium: Secondary | ICD-10-CM | POA: Diagnosis not present

## 2016-05-29 NOTE — Progress Notes (Signed)
Subjective:  83year old female presents complaining of sore toe 3rd right. Patient is requesting toe nails and calluses trimmed.   Objective:  All nails are thick and hypertrophic with yellow debris under the big toe. Severe thick callus under the 5th MPJ and at the base of the 5th Metatarsal left foot.  Severe painful corn at distal end of 3rd toe right. Plantar posterior right heel callus.  Overriding 2nd toe on top of the 3rd right foot.  Assessment:  Onychomycosis x 10.  Painful callus under 5th MPJ and base right foot.  Pre ulcerative digital corn 2nd and 3rd digit right.  Plan:  Reviewed findings.  All nails and calluses debrided. Patient wants to return for Flexor tenotomy on 3rd right to prevent pain and ulceration.  Buttress pad placed under the 2nd and 3rd digit right. Patient will inform us when she is ready.

## 2016-05-29 NOTE — Patient Instructions (Signed)
Painful toe 2nd and 3rd right. Pre ulcerative lesions. Debrided and padded. All nails debrided. Return in one month for follow up on 2nd and 3rd toe right.

## 2016-05-30 ENCOUNTER — Encounter: Payer: Self-pay | Admitting: Podiatry

## 2016-06-29 ENCOUNTER — Encounter: Payer: Self-pay | Admitting: Podiatry

## 2016-06-29 ENCOUNTER — Ambulatory Visit (INDEPENDENT_AMBULATORY_CARE_PROVIDER_SITE_OTHER): Payer: Medicare Other | Admitting: Podiatry

## 2016-06-29 DIAGNOSIS — M79604 Pain in right leg: Secondary | ICD-10-CM

## 2016-06-29 DIAGNOSIS — L97511 Non-pressure chronic ulcer of other part of right foot limited to breakdown of skin: Secondary | ICD-10-CM

## 2016-06-29 DIAGNOSIS — I739 Peripheral vascular disease, unspecified: Secondary | ICD-10-CM | POA: Diagnosis not present

## 2016-06-29 DIAGNOSIS — M79671 Pain in right foot: Secondary | ICD-10-CM | POA: Diagnosis not present

## 2016-06-29 NOTE — Patient Instructions (Signed)
Seen for painful toe 2nd right. Noted of inflamed corn with pre ulcerative condition. Area debrided and padded. Keep the pad till next week. Return in one week.

## 2016-06-29 NOTE — Progress Notes (Signed)
Subjective:  83year old female presents complaining of pain in 2nd toe right. Patient is wearing buttress pad under the 3rd toe right.   Objective:  Inflamed 2nd toe with painful pre ulcerative corn 2nd right. Recurring corn at distal end of 3rd toe right. Plantar posterior right heel callus.  Overriding 2nd toe on top of the 3rd right foot. Pedal pulses are not palpable.  Assessment:  Painful callus under 5th MPJ and base right foot.  Pre ulcerative digital corn 2nd and 3rd digit right. PVD.  Plan:  Reviewed findings.  All lesions debrided and aperture pad placed on the 2nd and 3rd right.  Return in one week for follow up on the 2nd toe right.

## 2016-07-06 ENCOUNTER — Encounter: Payer: Self-pay | Admitting: Podiatry

## 2016-07-06 ENCOUNTER — Ambulatory Visit (INDEPENDENT_AMBULATORY_CARE_PROVIDER_SITE_OTHER): Payer: Medicare Other | Admitting: Podiatry

## 2016-07-06 DIAGNOSIS — L97511 Non-pressure chronic ulcer of other part of right foot limited to breakdown of skin: Secondary | ICD-10-CM

## 2016-07-06 DIAGNOSIS — I739 Peripheral vascular disease, unspecified: Secondary | ICD-10-CM | POA: Diagnosis not present

## 2016-07-06 DIAGNOSIS — M79671 Pain in right foot: Secondary | ICD-10-CM

## 2016-07-06 DIAGNOSIS — M79604 Pain in right leg: Secondary | ICD-10-CM

## 2016-07-06 NOTE — Progress Notes (Signed)
Subjective:  83year old female presents for follow up on 2nd toe lesion right. Stated that pain is much less.  Patient is wearing pad under 2nd and 3rd toe right.   Objective:  2nd toe ulcer has healed and covered with hard digital corn right. Recurring corn at distal end of 3rd toe right. Plantar posterior right heel callus.  Overriding 2nd toe on top of the 3rd right foot. Pedal pulses are not palpable.  Assessment:  Healed  ulcerative digital corn 2nd and 3rd digit right. Severe digital contracture 2nd and 3rd right. PVD.  Plan:  Reviewed findings.  All lesions debrided and aperture pad placed on the 2nd and 3rd right.  Dispensed a sheet of doughnut pads to use in 2nd toe at home.  Return in one month for follow up on the 2nd toe right.

## 2016-07-06 NOTE — Patient Instructions (Signed)
Follow up on right 2nd toe ulcer. Noted of healing with callus build up. No open lesion noted. Debrided and padded on 2nd and 3rd with home care instruction. Return in one month.

## 2016-08-07 ENCOUNTER — Ambulatory Visit (INDEPENDENT_AMBULATORY_CARE_PROVIDER_SITE_OTHER): Payer: Medicare Other | Admitting: Podiatry

## 2016-08-07 DIAGNOSIS — I739 Peripheral vascular disease, unspecified: Secondary | ICD-10-CM | POA: Diagnosis not present

## 2016-08-07 DIAGNOSIS — L97511 Non-pressure chronic ulcer of other part of right foot limited to breakdown of skin: Secondary | ICD-10-CM

## 2016-08-07 DIAGNOSIS — M79671 Pain in right foot: Secondary | ICD-10-CM

## 2016-08-07 DIAGNOSIS — M79604 Pain in right leg: Secondary | ICD-10-CM

## 2016-08-07 NOTE — Patient Instructions (Signed)
Ulcerating corn debrided and padded. All nails debrided. Return in 2 weeks.

## 2016-08-07 NOTE — Progress Notes (Signed)
Subjective:  [age]year old female presents complaining of pain on 2nd toe and 3rd toe lesion right.   Objective:  2nd toe has thick build up interdigital corn at distal medial aspect. 3rd digit right has distal end keratotic tissue build up. Overriding 2nd toe on top of the 3rd right foot. Pedal pulses are not palpable.  Assessment:  Recurring pre ulcerative digital corn 2nd and 3rd digit right. Severe digital contracture 2nd and 3rd right. PVD.  Plan:  Reviewed findings.  All lesions debrided and aperture pad placed on the 2nd and 3rd right.  Return in one week for follow up.

## 2016-08-08 ENCOUNTER — Encounter: Payer: Self-pay | Admitting: Podiatry

## 2016-08-22 ENCOUNTER — Ambulatory Visit: Payer: Medicare Other | Admitting: Podiatry

## 2017-06-18 ENCOUNTER — Emergency Department (HOSPITAL_COMMUNITY)
Admission: EM | Admit: 2017-06-18 | Discharge: 2017-06-18 | Disposition: A | Discharge status: Home / Self Care | Payer: Medicare Other | Attending: Emergency Medicine | Admitting: Emergency Medicine

## 2017-06-18 ENCOUNTER — Encounter (HOSPITAL_COMMUNITY): Payer: Self-pay | Admitting: Emergency Medicine

## 2017-06-18 DIAGNOSIS — Y999 Unspecified external cause status: Secondary | ICD-10-CM | POA: Insufficient documentation

## 2017-06-18 DIAGNOSIS — Y939 Activity, unspecified: Secondary | ICD-10-CM | POA: Insufficient documentation

## 2017-06-18 DIAGNOSIS — Z711 Person with feared health complaint in whom no diagnosis is made: Secondary | ICD-10-CM | POA: Insufficient documentation

## 2017-06-18 DIAGNOSIS — Y92410 Unspecified street and highway as the place of occurrence of the external cause: Secondary | ICD-10-CM | POA: Insufficient documentation
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
## 2017-06-18 NOTE — Discharge Instructions (Signed)
It was my pleasure taking care of you today!   Tylenol as needed for muscle soreness.   Return to ER for new or worsening symptoms, any additional concerns.
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
## 2017-06-27 DIAGNOSIS — M25511 Pain in right shoulder: Secondary | ICD-10-CM | POA: Insufficient documentation

## 2017-07-02 ENCOUNTER — Other Ambulatory Visit: Payer: Self-pay

## 2017-07-02 ENCOUNTER — Ambulatory Visit: Payer: No Typology Code available for payment source | Attending: Family Medicine

## 2017-07-02 DIAGNOSIS — M25511 Pain in right shoulder: Secondary | ICD-10-CM | POA: Diagnosis present

## 2017-07-02 DIAGNOSIS — R252 Cramp and spasm: Secondary | ICD-10-CM | POA: Diagnosis present

## 2017-07-02 DIAGNOSIS — M542 Cervicalgia: Secondary | ICD-10-CM | POA: Diagnosis present

## 2017-07-02 DIAGNOSIS — M6281 Muscle weakness (generalized): Secondary | ICD-10-CM | POA: Diagnosis present

## 2017-07-02 DIAGNOSIS — M25611 Stiffness of right shoulder, not elsewhere classified: Secondary | ICD-10-CM | POA: Diagnosis present
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
## 2017-07-02 NOTE — Patient Instructions (Addendum)
Access Code: [email or web address]  URL: [email or web address]  Date: 07/02/2017  Prepared by: Lorrene ReidKelly Takacs   Exercises  Seated Shoulder Shrugs - 10 reps - 3x daily - 7x weekly  Neck Sidebending - 13 reps - 1 sets - 20 hold - 3x daily - 7x weekly  Seated Cervical Rotation AROM - 10 reps - 3 sets - 1x daily - 7x weekly  Seated Shoulder Flexion Slide at Table Top with Forearm in Neutral - 10 reps - 1 sets - 10 hold - 2x daily - 7x weekly   [facility] [address], [address] [place], [place] [address] Phone # [phone] Fax [phone]

## 2017-07-05 ENCOUNTER — Encounter: Payer: Self-pay | Admitting: Physical Therapy

## 2017-07-05 ENCOUNTER — Ambulatory Visit: Payer: No Typology Code available for payment source | Admitting: Physical Therapy

## 2017-07-05 DIAGNOSIS — M542 Cervicalgia: Secondary | ICD-10-CM

## 2017-07-05 DIAGNOSIS — R252 Cramp and spasm: Secondary | ICD-10-CM

## 2017-07-05 DIAGNOSIS — M6281 Muscle weakness (generalized): Secondary | ICD-10-CM

## 2017-07-05 DIAGNOSIS — M25611 Stiffness of right shoulder, not elsewhere classified: Secondary | ICD-10-CM

## 2017-07-05 DIAGNOSIS — M25511 Pain in right shoulder: Secondary | ICD-10-CM | POA: Diagnosis not present
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
## 2017-07-09 ENCOUNTER — Ambulatory Visit: Payer: No Typology Code available for payment source | Admitting: Physical Therapy

## 2017-07-09 ENCOUNTER — Encounter: Payer: Self-pay | Admitting: Physical Therapy

## 2017-07-09 DIAGNOSIS — M542 Cervicalgia: Secondary | ICD-10-CM

## 2017-07-09 DIAGNOSIS — M25611 Stiffness of right shoulder, not elsewhere classified: Secondary | ICD-10-CM

## 2017-07-09 DIAGNOSIS — M25511 Pain in right shoulder: Secondary | ICD-10-CM | POA: Diagnosis not present

## 2017-07-09 DIAGNOSIS — R252 Cramp and spasm: Secondary | ICD-10-CM

## 2017-07-09 DIAGNOSIS — M6281 Muscle weakness (generalized): Secondary | ICD-10-CM
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
## 2017-07-12 ENCOUNTER — Encounter: Payer: Self-pay | Admitting: Physical Therapy

## 2017-07-12 ENCOUNTER — Ambulatory Visit: Payer: No Typology Code available for payment source | Admitting: Physical Therapy

## 2017-07-12 DIAGNOSIS — M25611 Stiffness of right shoulder, not elsewhere classified: Secondary | ICD-10-CM

## 2017-07-12 DIAGNOSIS — M25511 Pain in right shoulder: Secondary | ICD-10-CM | POA: Diagnosis not present

## 2017-07-12 DIAGNOSIS — R252 Cramp and spasm: Secondary | ICD-10-CM

## 2017-07-12 DIAGNOSIS — M6281 Muscle weakness (generalized): Secondary | ICD-10-CM

## 2017-07-12 DIAGNOSIS — M542 Cervicalgia: Secondary | ICD-10-CM
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
## 2017-07-16 ENCOUNTER — Ambulatory Visit: Payer: No Typology Code available for payment source | Admitting: Physical Therapy

## 2017-07-16 DIAGNOSIS — M25611 Stiffness of right shoulder, not elsewhere classified: Secondary | ICD-10-CM

## 2017-07-16 DIAGNOSIS — M542 Cervicalgia: Secondary | ICD-10-CM

## 2017-07-16 DIAGNOSIS — R252 Cramp and spasm: Secondary | ICD-10-CM

## 2017-07-16 DIAGNOSIS — M6281 Muscle weakness (generalized): Secondary | ICD-10-CM

## 2017-07-16 DIAGNOSIS — M25511 Pain in right shoulder: Secondary | ICD-10-CM

## 2017-07-16 NOTE — Therapy (Signed)
[facility] [address] [place], [place], [address] Phone: [phone]   Fax:  [phone]  Physical Therapy Treatment  Patient Details  Name: Monique Young MRN: [id number] Date of Birth: November 09, 1930 Referring Provider: Tracey Harries, MD   Encounter Date: 07/16/2017  PT End of Session - 07/16/17 1157    Visit Number  5    Date for PT Re-Evaluation  08/27/17    Authorization Type  Medicare/BCBS    PT Start Time  1157 pt late almost 15 min    PT Stop Time  1221    PT Time Calculation (min)  24 min    Activity Tolerance  Patient tolerated treatment well    Behavior During Therapy  [facility] for tasks assessed/performed       Past Medical History:  Diagnosis Date  . Allergy     Past Surgical History:  Procedure Laterality Date  . BUNIONECTOMY Right 10/23/2013   @[facility]  . Hammer Toe Repair Right 10/23/2013   Rt #2 @[facility]     There were no vitals filed for this visit.                    [facility] Adult PT Treatment/Exercise - 07/16/17 0001      Neck Exercises: Seated   Cervical Rotation  Left;10 reps VC for posture      Shoulder Exercises: Seated   Horizontal ABduction  Strengthening;Both;10 reps;Theraband    Theraband Level (Shoulder Horizontal ABduction)  Level 1 (Yellow)    Flexion  AROM;Strengthening;Right;10 reps;Weights    Flexion Weight (lbs)  1    Abduction  -- Scaption 1# 10x    Other Seated Exercises  Scap squeezes 10x 3 sec holds, circles 20x    Other Seated Exercises  Bicep curls  2# 15x PTA assit so pt wouldn't lift arm      Shoulder Exercises: Pulleys   Flexion  3 minutes    Scaption  3 minutes      Manual Therapy   Manual Therapy  Soft tissue mobilization    Soft tissue mobilization  Instrument assit Rt upper trap inhibition, ripple up & down arm/neck/ and post shoulder               PT Short Term Goals - 07/02/17 1008      PT SHORT TERM GOAL #1   Title  be independent in initial HEP    Time  4    Period  Weeks    Status  New    Target Date  07/30/17      PT SHORT TERM GOAL #2   Title  demonstrate Rt shoulder A/ROM flexion to > or = to 110 degrees to improve overhead reaching    Time  4    Period  Weeks    Status  New    Target Date  07/30/17      PT SHORT TERM GOAL #3   Title  report < or = to 6/10 Rt shoulder pain with use with ADLs and self-care    Time  4    Period  Weeks    Status  New    Target Date  07/30/17      PT SHORT TERM GOAL #4   Title  report 25% less Rt UE pain with sleep at night    Time  4    Period  Weeks    Status  New    Target Date  07/30/17        PT Long Term Goals - 07/02/17 1008      PT LONG TERM GOAL #1   Title  be independent in advanced HEP    Time  8    Period  Weeks    Status  New    Target Date  08/27/17      PT LONG TERM GOAL #2   Title  reduce FOTO to < or = to 36% limitation    Time  8    Period  Weeks    Status  New    Target Date  08/27/17      PT LONG TERM GOAL #3   Title  demonstrate Rt shoulder A/ROM to > or = to 130 degrees to allow for reaching overhead    Time  8    Period  Weeks    Status  New    Target Date  08/27/17      PT LONG TERM GOAL #4   Title  report < or = to 4/10 Rt shoulder pain with use with ADLs and self-care    Time  8    Period  Weeks    Status  New    Target Date  08/27/17      PT LONG TERM GOAL #5   Title  demonstrate 4 to 4+/5 Rt shoulder strength to improve endurance for use    Time  8    Period  Weeks    Status  New    Target Date  08/27/17            Plan - 07/16/17 1223    Clinical Impression Statement  Pt was 15 min late for tx today. No pain reported today in her shoulder but at some time during the treatment she reported her 4th toe on her Rt is hurting and swollen. She verbalized plans to call MD to discuss. This pain seemed to be a distraction for her. Continued to need verbal cuing on her technique, and posture.        Rehab Potential  Good    PT Frequency  2x / week    PT Duration  8 weeks    PT Treatment/Interventions  ADLs/Self Care Home Management;Cryotherapy;Electrical Stimulation;Ultrasound;Moist Heat;Therapeutic activities;Therapeutic exercise;Neuromuscular re-education;Patient/family education;Manual techniques;Passive range of motion;Taping;Dry needling    PT Next Visit Plan  posutral education, work on scapular coordination, Rt shoulder and cervical A/ROM, manual and modalities as needed    PT Home Exercise Plan   Access Code: [email or web address]     Consulted and Agree with Plan of Care  Patient       Patient will benefit from skilled therapeutic intervention in order to improve the following deficits and impairments:  Abnormal gait, Pain, Impaired flexibility, Impaired UE functional use, Decreased strength, Decreased range of motion, Decreased endurance, Decreased activity tolerance, Postural dysfunction, Increased muscle spasms, Improper body mechanics  Visit Diagnosis: Acute pain of right shoulder  Stiffness of right shoulder, not elsewhere classified  Cervicalgia  Muscle weakness (generalized)  Cramp and spasm     Problem List Patient Active Problem List   Diagnosis Date Noted  . Toe ulcer, right (HCC) 04/29/2015  . Plantar fasciitis of right foot 03/31/2014  . Status post right foot surgery 10/28/2013  . HAV (hallux abducto valgus) 10/01/2013  . Bunion 10/01/2013  . Ulcer of other part of foot 09/17/2013  . Pain in lower limb 09/17/2013  . Other hammer toe (acquired) 09/17/2013  . Onychomycosis  09/17/2013  . URINARY FREQUENCY 10/22/2009  . HYPERLIPIDEMIA 08/19/2008  . PANCYTOPENIA 08/19/2008  . ANEMIA-NOS 08/19/2008  . MIGRAINE HEADACHE 08/19/2008  . ALLERGIC RHINITIS 08/19/2008  . CALLUS, RIGHT FOOT 08/19/2008  . Headache 08/19/2008  . TB SKIN TEST, POSITIVE 08/19/2008  . UTI'S, HX OF 08/19/2008    Cyndie Woodbeck, PTA 07/16/2017, 12:27 PM  [facility] [address] [place], [place], [address] Phone: [phone]   Fax:  [phone]  Name: Monique Young MRN: [id number] Date of Birth: 20-Sep-1930

## 2017-07-19 ENCOUNTER — Ambulatory Visit: Payer: No Typology Code available for payment source | Admitting: Physical Therapy

## 2017-07-19 ENCOUNTER — Encounter: Payer: Self-pay | Admitting: Physical Therapy

## 2017-07-19 DIAGNOSIS — M542 Cervicalgia: Secondary | ICD-10-CM

## 2017-07-19 DIAGNOSIS — M25611 Stiffness of right shoulder, not elsewhere classified: Secondary | ICD-10-CM

## 2017-07-19 DIAGNOSIS — M25511 Pain in right shoulder: Secondary | ICD-10-CM | POA: Diagnosis not present

## 2017-07-19 DIAGNOSIS — M6281 Muscle weakness (generalized): Secondary | ICD-10-CM

## 2017-07-19 DIAGNOSIS — R252 Cramp and spasm: Secondary | ICD-10-CM
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
## 2017-07-23 ENCOUNTER — Ambulatory Visit: Payer: No Typology Code available for payment source | Admitting: Physical Therapy

## 2017-07-23 ENCOUNTER — Encounter: Payer: Self-pay | Admitting: Physical Therapy

## 2017-07-23 ENCOUNTER — Telehealth: Payer: Self-pay | Admitting: Physical Therapy

## 2017-07-23 DIAGNOSIS — M25511 Pain in right shoulder: Secondary | ICD-10-CM | POA: Diagnosis not present

## 2017-07-23 DIAGNOSIS — M6281 Muscle weakness (generalized): Secondary | ICD-10-CM

## 2017-07-23 DIAGNOSIS — M25611 Stiffness of right shoulder, not elsewhere classified: Secondary | ICD-10-CM

## 2017-07-23 DIAGNOSIS — R252 Cramp and spasm: Secondary | ICD-10-CM

## 2017-07-23 DIAGNOSIS — M542 Cervicalgia: Secondary | ICD-10-CM

## 2017-07-23 NOTE — Telephone Encounter (Signed)
Left message for pt due to no show for PT appt today.  Advised to call office back. Clarita Crane, PT, DPT 07/23/17 11:21 AM
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
## 2017-07-26 ENCOUNTER — Ambulatory Visit: Payer: Medicare Other | Attending: Family Medicine | Admitting: Physical Therapy

## 2017-07-26 ENCOUNTER — Encounter: Payer: Self-pay | Admitting: Physical Therapy

## 2017-07-26 DIAGNOSIS — R252 Cramp and spasm: Secondary | ICD-10-CM

## 2017-07-26 DIAGNOSIS — M25511 Pain in right shoulder: Secondary | ICD-10-CM | POA: Diagnosis not present

## 2017-07-26 DIAGNOSIS — M6281 Muscle weakness (generalized): Secondary | ICD-10-CM | POA: Insufficient documentation

## 2017-07-26 DIAGNOSIS — M25611 Stiffness of right shoulder, not elsewhere classified: Secondary | ICD-10-CM | POA: Insufficient documentation

## 2017-07-26 DIAGNOSIS — M542 Cervicalgia: Secondary | ICD-10-CM | POA: Diagnosis present

## 2017-07-26 NOTE — Therapy (Signed)
[facility] [address], [place], [place], [address] Phone: [phone]   Fax:  [phone]  Physical Therapy Treatment  Patient Details  Name: Monique Young MRN: [id number] Date of Birth: 01-02-1931 Referring Provider: Bernerd Limbo, MD   Encounter Date: 07/26/2017  PT End of Session - 07/26/17 1219    Visit Number  8    Date for PT Re-Evaluation  08/27/17    Authorization Type  Medicare/BCBS    PT Start Time  1130    PT Stop Time  1210    PT Time Calculation (min)  40 min    Activity Tolerance  Patient tolerated treatment well    Behavior During Therapy  Agitated Pt seems annoyed with PTA today. Difficult to get pt to respond to questions regarding her response to exercise.        Past Medical History:  Diagnosis Date  . Allergy     Past Surgical History:  Procedure Laterality Date  . BUNIONECTOMY Right 10/23/2013   @[facility]  . Hammer Toe Repair Right 10/23/2013   Rt #2 @[facility]     There were no vitals filed for this visit.  Subjective Assessment - 07/26/17 1133    Subjective  pain is pretty much resolved    Patient Stated Goals  reduce neck and Rt shoulder pain, use Rt UE without limitation    Currently in Pain?  Yes    Pain Score  4     Pain Location  Shoulder    Pain Orientation  Right    Pain Descriptors / Indicators  Dull    Pain Type  Acute pain    Pain Onset  1 to 4 weeks ago    Pain Frequency  Constant    Aggravating Factors   sleep at night, activity someteimes    Pain Relieving Factors  heat, tylenol PRN         [facility] PT Assessment - 07/26/17 1147      Observation/Other Assessments   Focus on Therapeutic Outcomes (FOTO)   70 (30% limitation)      AROM   Right Shoulder Flexion  131 Degrees                   [facility] Adult PT Treatment/Exercise - 07/26/17 1135      Neck Exercises: Seated   Shoulder Shrugs  15 reps      Neck Exercises: Supine   Cervical  Rotation  Both;10 reps 10 sec hold      Shoulder Exercises: Supine   Flexion  Right;Weights;15 reps    Shoulder Flexion Weight (lbs)  2      Shoulder Exercises: Seated   Flexion  AROM;Strengthening;Right;20 reps;Weights    Flexion Weight (lbs)  2    Abduction  -- Scaption 2# 20x     Other Seated Exercises  Bicep curls 2# 20x  VC to slow speed      Shoulder Exercises: Sidelying   External Rotation  Right;15 reps;Weights    External Rotation Weight (lbs)  2    ABduction  Right;15 reps;Weights    ABduction Weight (lbs)  2      Shoulder Exercises: Pulleys   Flexion  3 minutes    Scaption  3 minutes      Neck Exercises: Stretches   Upper Trapezius Stretch  Right;Left;3 reps;10 seconds               PT Short Term Goals - 07/26/17 1219  PT SHORT TERM GOAL #1   Title  be independent in initial HEP    Time  4    Period  Weeks    Status  Achieved      PT SHORT TERM GOAL #2   Title  demonstrate Rt shoulder A/ROM flexion to > or = to 110 degrees to improve overhead reaching    Time  4    Period  Weeks    Status  Achieved      PT SHORT TERM GOAL #3   Title  report < or = to 6/10 Rt shoulder pain with use with ADLs and self-care    Time  4    Period  Weeks    Status  Achieved      PT SHORT TERM GOAL #4   Title  report 25% less Rt UE pain with sleep at night    Time  4    Period  Weeks    Status  Achieved        PT Long Term Goals - 07/26/17 1220      PT LONG TERM GOAL #1   Title  be independent in advanced HEP    Time  8    Period  Weeks    Status  On-going    Target Date  08/27/17      PT LONG TERM GOAL #2   Title  reduce FOTO to < or = to 36% limitation    Time  8    Period  Weeks    Status  Achieved      PT LONG TERM GOAL #3   Title  demonstrate Rt shoulder A/ROM to > or = to 130 degrees to allow for reaching overhead    Time  8    Period  Weeks    Status  Achieved      PT LONG TERM GOAL #4   Title  report < or = to 4/10 Rt shoulder pain  with use with ADLs and self-care    Time  8    Period  Weeks    Status  Achieved      PT LONG TERM GOAL #5   Title  demonstrate 4 to 4+/5 Rt shoulder strength to improve endurance for use    Time  8    Period  Weeks    Status  On-going            Plan - 07/26/17 1220    Clinical Impression Statement  Pt has met all STGs and 3 LTGs.  Anticipate pt will be ready for d/c next 1-2 visits.  Continues to need mod to max cues for technique with exercises.    Rehab Potential  Good    PT Frequency  2x / week    PT Duration  8 weeks    PT Treatment/Interventions  ADLs/Self Care Home Management;Cryotherapy;Electrical Stimulation;Ultrasound;Moist Heat;Therapeutic activities;Therapeutic exercise;Neuromuscular re-education;Patient/family education;Manual techniques;Passive range of motion;Taping;Dry needling    PT Next Visit Plan  posutral education, work on scapular coordination, Rt shoulder and cervical A/ROM, manual and modalities as needed; look at adding to HEP as able    PT Home Exercise Plan   Access Code: [email or web address]     Consulted and Agree with Plan of Care  Patient       Patient will benefit from skilled therapeutic intervention in order to improve the following deficits and impairments:  Abnormal gait, Pain, Impaired flexibility, Impaired UE functional use, Decreased strength, Decreased range of motion,  Decreased endurance, Decreased activity tolerance, Postural dysfunction, Increased muscle spasms, Improper body mechanics  Visit Diagnosis: Acute pain of right shoulder  Stiffness of right shoulder, not elsewhere classified  Cervicalgia  Muscle weakness (generalized)  Cramp and spasm     Problem List Patient Active Problem List   Diagnosis Date Noted  . Toe ulcer, right ([place]) 04/29/2015  . Plantar fasciitis of right foot 03/31/2014  . Status post right foot surgery 10/28/2013  . HAV (hallux abducto valgus) 10/01/2013  . Bunion 10/01/2013  . Ulcer of other part of foot  09/17/2013  . Pain in lower limb 09/17/2013  . Other hammer toe (acquired) 09/17/2013  . Onychomycosis 09/17/2013  . URINARY FREQUENCY 10/22/2009  . HYPERLIPIDEMIA 08/19/2008  . PANCYTOPENIA 08/19/2008  . ANEMIA-NOS 08/19/2008  . MIGRAINE HEADACHE 08/19/2008  . ALLERGIC RHINITIS 08/19/2008  . CALLUS, RIGHT FOOT 08/19/2008  . Headache 08/19/2008  . TB SKIN TEST, POSITIVE 08/19/2008  . Emiliano Dyer OF 08/19/2008      Laureen Abrahams, PT, DPT 07/26/17 12:21 PM    [facility] [address], [place], [place], [address] Phone: [phone]   Fax:  [phone]  Name: TABBETHA KUTSCHER MRN: [id number] Date of Birth: 10/08/1930

## 2017-07-30 ENCOUNTER — Ambulatory Visit: Payer: Medicare Other

## 2017-07-30 DIAGNOSIS — M25511 Pain in right shoulder: Secondary | ICD-10-CM

## 2017-07-30 DIAGNOSIS — M6281 Muscle weakness (generalized): Secondary | ICD-10-CM

## 2017-07-30 DIAGNOSIS — M542 Cervicalgia: Secondary | ICD-10-CM

## 2017-07-30 DIAGNOSIS — M25611 Stiffness of right shoulder, not elsewhere classified: Secondary | ICD-10-CM

## 2017-07-30 NOTE — Therapy (Signed)
[facility] [address] [place], [place], [address] Phone: [phone]   Fax:  [phone]  Physical Therapy Treatment  Patient Details  Name: Monique Young MRN: [id number] Date of Birth: [birth year] Referring Provider: Tracey Harries, MD   Encounter Date: 07/30/2017  PT End of Session - 07/30/17 1044    Visit Number  9    Date for PT Re-Evaluation  08/27/17    PT Start Time  1014    PT Stop Time  1048    PT Time Calculation (min)  34 min    Activity Tolerance  Patient tolerated treatment well    Behavior During Therapy  [facility] for tasks assessed/performed       Past Medical History:  Diagnosis Date  . Allergy     Past Surgical History:  Procedure Laterality Date  . BUNIONECTOMY Right 10/23/2013    [facility]  . Hammer Toe Repair Right 10/23/2013   Rt #2     There were no vitals filed for this visit.  Subjective Assessment - 07/30/17 1018    Subjective  This is my last week.  I'm doing good.      Currently in Pain?  No/denies                       [facility] Adult PT Treatment/Exercise - 07/30/17 0001      Neck Exercises: Machines for Strengthening   UBE (Upper Arm Bike)  Level 0x 6 minutes (3/3)      Neck Exercises: Seated   Shoulder Shrugs  15 reps      Neck Exercises: Supine   Cervical Rotation  Both;10 reps 10 sec hold      Shoulder Exercises: Seated   Flexion  AROM;Strengthening;Right;20 reps;Weights    Flexion Weight (lbs)  2    Abduction  -- Scaption 2# 20x     Other Seated Exercises  Bicep curls 2# 20x  VC to slow speed      Shoulder Exercises: Sidelying   ABduction  Right;15 reps;Weights    ABduction Weight (lbs)  2      Shoulder Exercises: Pulleys   Flexion  3 minutes    Scaption  3 minutes      Neck Exercises: Stretches   Upper Trapezius Stretch  Right;Left;3 reps;10 seconds               PT Short Term Goals - 07/26/17 1219      PT  SHORT TERM GOAL #1   Title  be independent in initial HEP    Time  4    Period  Weeks    Status  Achieved      PT SHORT TERM GOAL #2   Title  demonstrate Rt shoulder A/ROM flexion to > or = to 110 degrees to improve overhead reaching    Time  4    Period  Weeks    Status  Achieved      PT SHORT TERM GOAL #3   Title  report < or = to 6/10 Rt shoulder pain with use with ADLs and self-care    Time  4    Period  Weeks    Status  Achieved      PT SHORT TERM GOAL #4   Title  report 25% less Rt UE pain with sleep at night    Time  4    Period  Weeks    Status  Achieved  PT Long Term Goals - 07/30/17 1021      PT LONG TERM GOAL #2   Title  reduce FOTO to < or = to 36% limitation    Baseline  30% limitaiton    Status  Achieved      PT LONG TERM GOAL #3   Title  demonstrate Rt shoulder A/ROM to > or = to 130 degrees to allow for reaching overhead    Status  Achieved              Patient will benefit from skilled therapeutic intervention in order to improve the following deficits and impairments:     Visit Diagnosis: Acute pain of right shoulder  Stiffness of right shoulder, not elsewhere classified  Cervicalgia  Muscle weakness (generalized)     Problem List Patient Active Problem List   Diagnosis Date Noted  . Toe ulcer, right ([facility]) 04/29/2015  . Plantar fasciitis of right foot 03/31/2014  . Status post right foot surgery 10/28/2013  . HAV (hallux abducto valgus) 10/01/2013  . Bunion 10/01/2013  . Ulcer of other part of foot 09/17/2013  . Pain in lower limb 09/17/2013  . Other hammer toe (acquired) 09/17/2013  . Onychomycosis 09/17/2013  . URINARY FREQUENCY 10/22/2009  . HYPERLIPIDEMIA 08/19/2008  . PANCYTOPENIA 08/19/2008  . ANEMIA-NOS 08/19/2008  . MIGRAINE HEADACHE 08/19/2008  . ALLERGIC RHINITIS 08/19/2008  . CALLUS, RIGHT FOOT 08/19/2008  . Headache 08/19/2008  . TB SKIN TEST, POSITIVE 08/19/2008  . UTI'S, HX OF 08/19/2008      Monique Young, PT 07/30/17 10:47 AM  [facility] [address], [address] [place], [place], [address] Phone: [phone]   Fax:  [phone]  Name: Monique Young MRN: [id number] Date of Birth: 06-Sep-1930

## 2017-08-02 ENCOUNTER — Ambulatory Visit: Payer: Medicare Other

## 2017-08-02 DIAGNOSIS — R252 Cramp and spasm: Secondary | ICD-10-CM

## 2017-08-02 DIAGNOSIS — M25511 Pain in right shoulder: Secondary | ICD-10-CM | POA: Diagnosis not present

## 2017-08-02 DIAGNOSIS — M25611 Stiffness of right shoulder, not elsewhere classified: Secondary | ICD-10-CM

## 2017-08-02 DIAGNOSIS — M6281 Muscle weakness (generalized): Secondary | ICD-10-CM

## 2017-08-02 DIAGNOSIS — M542 Cervicalgia: Secondary | ICD-10-CM
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
## 2017-08-25 ENCOUNTER — Other Ambulatory Visit: Payer: Self-pay

## 2017-08-25 ENCOUNTER — Emergency Department (HOSPITAL_COMMUNITY): Payer: Medicare Other

## 2017-08-25 ENCOUNTER — Emergency Department (HOSPITAL_COMMUNITY)
Admission: EM | Admit: 2017-08-25 | Discharge: 2017-08-25 | Disposition: A | Discharge status: Home / Self Care | Payer: Medicare Other | Attending: Emergency Medicine | Admitting: Emergency Medicine

## 2017-08-25 ENCOUNTER — Encounter (HOSPITAL_COMMUNITY): Payer: Self-pay

## 2017-08-25 DIAGNOSIS — R0981 Nasal congestion: Secondary | ICD-10-CM | POA: Diagnosis not present

## 2017-08-25 DIAGNOSIS — R51 Headache: Secondary | ICD-10-CM | POA: Diagnosis not present

## 2017-08-25 DIAGNOSIS — H9203 Otalgia, bilateral: Secondary | ICD-10-CM | POA: Insufficient documentation

## 2017-08-25 DIAGNOSIS — R42 Dizziness and giddiness: Secondary | ICD-10-CM | POA: Diagnosis present

## 2017-08-25 LAB — BASIC METABOLIC PANEL
ANION GAP: 6 (ref 5–15)
BUN: 19 mg/dL (ref 6–20)
CALCIUM: 9.3 mg/dL (ref 8.9–10.3)
CO2: 28 mmol/L (ref 22–32)
Chloride: 107 mmol/L (ref 101–111)
Creatinine, Ser: 0.84 mg/dL (ref 0.44–1.00)
Glucose, Bld: 150 mg/dL — ABNORMAL HIGH (ref 65–99)
Potassium: 4.4 mmol/L (ref 3.5–5.1)
Sodium: 141 mmol/L (ref 135–145)

## 2017-08-25 LAB — CBC
HCT: 35.7 % — ABNORMAL LOW (ref 36.0–46.0)
HEMOGLOBIN: 11.6 g/dL — AB (ref 12.0–15.0)
MCH: 33 pg (ref 26.0–34.0)
MCHC: 32.5 g/dL (ref 30.0–36.0)
MCV: 101.7 fL — ABNORMAL HIGH (ref 78.0–100.0)
Platelets: 124 10*3/uL — ABNORMAL LOW (ref 150–400)
RBC: 3.51 MIL/uL — AB (ref 3.87–5.11)
RDW: 14.1 % (ref 11.5–15.5)
WBC: 3.8 10*3/uL — AB (ref 4.0–10.5)

## 2017-08-25 LAB — URINALYSIS, ROUTINE W REFLEX MICROSCOPIC
BILIRUBIN URINE: NEGATIVE
Glucose, UA: NEGATIVE mg/dL
Hgb urine dipstick: NEGATIVE
Ketones, ur: NEGATIVE mg/dL
Leukocytes, UA: NEGATIVE
NITRITE: NEGATIVE
Protein, ur: NEGATIVE mg/dL
SPECIFIC GRAVITY, URINE: 1.019 (ref 1.005–1.030)
pH: 5 (ref 5.0–8.0)

## 2017-08-25 NOTE — Discharge Instructions (Signed)
Please read and follow all provided instructions.  Your diagnoses today include:  1. Vertigo     Tests performed today include:  CT of your head - no problems   Blood counts and electrolytes - no problems  EKG of your heart - no signs of heart problems  Vital signs. See below for your results today.   Medications prescribed:   None  Take any prescribed medications only as directed.  Home care instructions:  Follow any educational materials contained in this packet.  BE VERY CAREFUL not to take multiple medicines containing Tylenol (also called acetaminophen). Doing so can lead to an overdose which can damage your liver and cause liver failure and possibly death.   Follow-up instructions: Please follow-up with your primary care provider in the next 3 days for further evaluation of your symptoms.   Return instructions:   Please return to the Emergency Department if you experience worsening symptoms.   Return if you have weakness in your arms or legs, slurred speech, trouble walking or talking, confusion, or trouble with your balance.   Please return if you have any other emergent concerns.  Additional Information:  Your vital signs today were: BP (!) 163/90    Pulse 61    Temp 98.4 F (36.9 C) (Oral)    Resp 11    Ht 5' (1.524 m)    Wt 74.4 kg (164 lb)    SpO2 100%    BMI 32.03 kg/m  If your blood pressure (BP) was elevated above 135/85 this visit, please have this repeated by your doctor within one month. --------------

## 2017-08-25 NOTE — ED Triage Notes (Signed)
Pt woke up today with dizziness.  Felt nauseated and has a headache.  No emesis.  Abdominal pain with belching.  No recent illness.  No chest pain.

## 2017-08-25 NOTE — ED Notes (Signed)
Patient has extra blood in the lab.  1 blue, and 1 gold 

## 2017-08-25 NOTE — ED Notes (Signed)
Pt ambulated from bathroom to room in hallway with 1 assist.

## 2017-08-25 NOTE — ED Provider Notes (Signed)
[facility]-EMERGENCY DEPT Provider Note   CSN: [id number] Arrival date & time: 08/25/17  1056     History   Chief Complaint Chief Complaint  Patient presents with  . Dizziness  . Headache    HPI Monique Young is a 84 y.o. female.  Patient with history of seasonal allergies presents the emergency department today after having an episode of dizziness described as a spinning sensation about 7 or 8 AM.  This occurred after she woke up.  Symptoms started acutely and resolved.  She was able to walk during the episode.  She had an associated generalized headache which has also resolved. Patient denies signs of stroke including: facial droop, slurred speech, aphasia, weakness/numbness in extremities, imbalance/trouble walking.  She called her neighbor who came over.  Patient was able to take a shower and dress herself without any difficulty.  While she was eating a piece of toast, she became very nauseous but did not vomit.  Patient has had some recent ear pain that she attributed to seasonal allergies.  With this she has congestion and a runny nose at times.  She otherwise is in good health.  She denies any chest pain or shortness of breath in any time during these episodes.  No recent nausea, vomiting, or diarrhea.  She is eating and drinking well.  She has had a remote episode of vertigo.      Past Medical History:  Diagnosis Date  . Allergy     Patient Active Problem List   Diagnosis Date Noted  . Toe ulcer, right ([facility]) 04/29/2015  . Plantar fasciitis of right foot 03/31/2014  . Status post right foot surgery 10/28/2013  . HAV (hallux abducto valgus) 10/01/2013  . Bunion 10/01/2013  . Ulcer of other part of foot 09/17/2013  . Pain in lower limb 09/17/2013  . Other hammer toe (acquired) 09/17/2013  . Onychomycosis 09/17/2013  . URINARY FREQUENCY 10/22/2009  . HYPERLIPIDEMIA 08/19/2008  . PANCYTOPENIA 08/19/2008  . ANEMIA-NOS 08/19/2008  . MIGRAINE  HEADACHE 08/19/2008  . ALLERGIC RHINITIS 08/19/2008  . CALLUS, RIGHT FOOT 08/19/2008  . Headache 08/19/2008  . TB SKIN TEST, POSITIVE 08/19/2008  . UTI'S, HX OF 08/19/2008    Past Surgical History:  Procedure Laterality Date  . BUNIONECTOMY Right 10/23/2013   @[facility]  . Hammer Toe Repair Right 10/23/2013   Rt #2 @[facility]      OB History   None      Home Medications    Prior to Admission medications   Medication Sig Start Date End Date Taking? Authorizing Provider  Acetaminophen (TYLENOL PO) Take 1 tablet by mouth as needed.    [provider]  cetirizine (ZYRTEC) 10 MG chewable tablet Chew 10 mg by mouth daily.    [provider]  fish oil-omega-3 fatty acids 1000 MG capsule Take 2 g by mouth daily.    [provider]  fluticasone (FLONASE) 50 MCG/ACT nasal spray Place into both nostrils daily.    [provider]  Multiple Vitamin (MULTIVITAMIN WITH MINERALS) TABS tablet Take 1 tablet by mouth daily.    [provider]    Family History Family History  Problem Relation Age of Onset  . Cancer Mother     Social History Social History   Tobacco Use  . Smoking status: Never Smoker  . Smokeless tobacco: Never Used  Substance Use Topics  . Alcohol use: No    Alcohol/week: 0.0 oz  . Drug use: No  Allergies   Iodine and Sulfonamide derivatives   Review of Systems Review of Systems  Constitutional: Negative for fever.  HENT: Positive for congestion and ear pain. Negative for dental problem, rhinorrhea and sinus pressure.   Eyes: Negative for photophobia, discharge, redness and visual disturbance.  Respiratory: Negative for shortness of breath.   Cardiovascular: Negative for chest pain.  Gastrointestinal: Negative for nausea and vomiting.  Musculoskeletal: Negative for gait problem, neck pain and neck stiffness.  Skin: Negative for rash.  Neurological: Positive for dizziness and headaches. Negative for  syncope, facial asymmetry, speech difficulty, weakness, light-headedness and numbness.  Psychiatric/Behavioral: Negative for confusion.     Physical Exam Updated Vital Signs BP (!) 163/67 (BP Location: Right Arm)   Pulse (!) 55   Temp 98.4 F (36.9 C) (Oral)   Resp 12   Ht 5' (1.524 m)   Wt 74.4 kg (164 lb)   SpO2 95%   BMI 32.03 kg/m   Physical Exam  Constitutional: She is oriented to person, place, and time. She appears well-developed and well-nourished.  HENT:  Head: Normocephalic and atraumatic.  Right Ear: Tympanic membrane, external ear and ear canal normal.  Left Ear: Tympanic membrane, external ear and ear canal normal.  Nose: Nose normal.  Mouth/Throat: Uvula is midline, oropharynx is clear and moist and mucous membranes are normal.  Eyes: Pupils are equal, round, and reactive to light. Conjunctivae, EOM and lids are normal. Right eye exhibits no nystagmus. Left eye exhibits no nystagmus.  Neck: Normal range of motion. Neck supple. Carotid bruit is not present (none heard).  Cardiovascular: Normal rate and regular rhythm.  Pulmonary/Chest: Effort normal and breath sounds normal.  Abdominal: Soft. There is no tenderness.  Musculoskeletal:       Cervical back: She exhibits normal range of motion, no tenderness and no bony tenderness.  Neurological: She is alert and oriented to person, place, and time. She has normal strength and normal reflexes. No cranial nerve deficit or sensory deficit. She displays a negative Romberg sign. Coordination and gait normal. GCS eye subscore is 4. GCS verbal subscore is 5. GCS motor subscore is 6.  Skin: Skin is warm and dry.  Psychiatric: She has a normal mood and affect.  Nursing note and vitals reviewed.    ED Treatments / Results  Labs (all labs ordered are listed, but only abnormal results are displayed) Labs Reviewed  BASIC METABOLIC PANEL - Abnormal; Notable for the following components:      Result Value   Glucose, Bld 150  (*)    All other components within normal limits  CBC - Abnormal; Notable for the following components:   WBC 3.8 (*)    RBC 3.51 (*)    Hemoglobin 11.6 (*)    HCT 35.7 (*)    MCV 101.7 (*)    Platelets 124 (*)    All other components within normal limits  URINALYSIS, ROUTINE W REFLEX MICROSCOPIC    EKG EKG Interpretation  Date/Time:  Saturday August 25 2017 11:39:29 EDT Ventricular Rate:  58 PR Interval:    QRS Duration: 81 QT Interval:  404 QTC Calculation: 397 R Axis:   52 Text Interpretation:  Sinus rhythm Abnormal R-wave progression, early transition Borderline repolarization abnormality Baseline wander in lead(s) I II aVR Abnormal ekg Confirmed by Gerhard Munch [phone]) on 08/25/2017 11:45:40 AM   Radiology Ct Head Wo Contrast  Result Date: 08/25/2017 CLINICAL DATA:  Dizziness today, nausea and headache. EXAM: CT HEAD WITHOUT CONTRAST TECHNIQUE: Contiguous axial  images were obtained from the base of the skull through the vertex without intravenous contrast. COMPARISON:  Head CT dated 04/25/2010. FINDINGS: Brain: No hydrocephalus. Mild chronic small vessel ischemic changes within the periventricular white matter. No mass, hemorrhage, edema or other evidence of acute parenchymal abnormality. No extra-axial hemorrhage. Vascular: There are chronic calcified atherosclerotic changes of the large vessels at the skull base. No unexpected hyperdense vessel. Skull: Normal. Negative for fracture or focal lesion. Sinuses/Orbits: No acute finding. Other: None. IMPRESSION: 1. No acute findings.  No intracranial mass, hemorrhage or edema. 2. Mild chronic small vessel ischemic change within the white matter. Electronically Signed   By: Bary Richard M.D.   On: 08/25/2017 15:52    Procedures Procedures (including critical care time)  Medications Ordered in ED Medications - No data to display   Initial Impression / Assessment and Plan / ED Course  I have reviewed the triage vital signs and  the nursing notes.  Pertinent labs & imaging results that were available during my care of the patient were reviewed by me and considered in my medical decision making (see chart for details).     Patient seen and examined. Work-up initiated. EKG reviewed.   Vital signs reviewed and are as follows: BP (!) 163/67 (BP Location: Right Arm)   Pulse (!) 55   Temp 98.4 F (36.9 C) (Oral)   Resp 12   Ht 5' (1.524 m)   Wt 74.4 kg (164 lb)   SpO2 95%   BMI 32.03 kg/m   Patient discussed with and seen by Dr. Denton Lank.   Patient remains at her baseline.  Head CT is normal.  Labs are reassuring.  Patient is able to walk.  Discussed that we are comfortable with discharged home at this point with PCP follow-up next week and return if worsening.  Patient verbalizes understanding and agrees with the plan.  Discussed with family at bedside as well.  Patient counseled to return if they have weakness in their arms or legs, slurred speech, trouble walking or talking, confusion, trouble with their balance, or if they have any other concerns. Patient verbalizes understanding and agrees with plan.    Final Clinical Impressions(s) / ED Diagnoses   Final diagnoses:  Vertigo   Patient was short-lived, transient episode of vertigo symptoms this morning.  Patient did not have any other neurological deficits.  She has a normal neurological exam here.  No chest pains, shortness of breath, syncope.  Low concern for cardiogenic etiology.  Low concern for TIA, however patient will need to follow-up with her primary care physician for consideration of further work-up.  Do not feel that given mild symptoms that have now completely resolved the patient requires admission for TIA work-up or MRI at this time.  She seems reliable to return with worsening symptoms.  ED Discharge Orders    None       Renne Crigler, Cordelia Poche 08/25/17 1756    Cathren Laine, MD 08/25/17 [phone]

## 2018-04-17 ENCOUNTER — Other Ambulatory Visit: Payer: Self-pay

## 2018-04-17 ENCOUNTER — Ambulatory Visit (INDEPENDENT_AMBULATORY_CARE_PROVIDER_SITE_OTHER): Payer: Medicare Other | Admitting: Podiatrist

## 2018-04-17 ENCOUNTER — Encounter: Payer: Self-pay | Admitting: Podiatrist

## 2018-04-17 VITALS — BP 119/60 | HR 72 | Resp 14

## 2018-04-17 DIAGNOSIS — L84 Corns and callosities: Secondary | ICD-10-CM

## 2018-04-17 DIAGNOSIS — L97511 Non-pressure chronic ulcer of other part of right foot limited to breakdown of skin: Secondary | ICD-10-CM

## 2018-04-17 DIAGNOSIS — I739 Peripheral vascular disease, unspecified: Secondary | ICD-10-CM | POA: Diagnosis not present

## 2018-04-17 NOTE — Progress Notes (Signed)
Subjective: Patient is a pleasant 85 year old female who presents today for painful lesion on the distal tip of the right third toe.  She has seen Dr. Salli Real in the past and presents today for follow-up.  She also has had a previous bunion fixed on the right foot which she relates led to crowding of the other toes.  Daryl Eastern figure out how to dictate on  objective: Neurological status is intact via light touch vibration and Semmes Weinstein monofilament at 5 out of 5 sites bilateral. Vascular status intact DP at 1/4 bilateral PT pulse unable to be palpated.  Mild to moderate nonpitting edema present bilateral.  Capillary refill time is slightly delayed.  At 5 seconds. Musculoskeletal exam reveals contracture of the right third toe at the distal interphalangeal joint.  Crowding of the digit is also present.  Overriding second toe noted right foot.  Left foot is rectus. Dermatological exam: Well-circumscribed callus lesion is present at the distal tip of the right third toe.  No underlining ulcer is present although it appears there has been one there in the past.  Skin is intact.  Remainder of the skin color texture and turgor is within normal limits. Enterotomy Assessment: Contracture of the right third digit. Ulcer/ Pre-ulcerative callus distal tip right third toe.  Plan: The patient was very tender to touch therefore I anesthetized the right third toe with a one-to-one mix of 0.5% Marcaine plain and lidocaine plain in a digital block fashion under sterile technique.  The pre-ulcerative lesion was then carefully debrided down to intact skin.  A toe lift device was applied and the patient was given instructions for aftercare.  She will return for continued care of this lesion and will call with any problems arise including break in the skin, bleeding, or symptoms or signs of infection of the foot.

## 2018-04-17 NOTE — Patient Instructions (Signed)

## 2018-04-17 NOTE — Progress Notes (Signed)
   Subjective:    Patient ID: Monique Young, female    DOB: 1931/01/19, 85 y.o.   MRN: [id number]  HPI    Review of Systems  All other systems reviewed and are negative.      Objective:   Physical Exam        Assessment & Plan:

## 2018-04-22 ENCOUNTER — Ambulatory Visit: Payer: Medicare Other | Admitting: Podiatrist

## 2018-07-17 ENCOUNTER — Ambulatory Visit: Payer: Medicare Other | Admitting: Podiatrist

## 2018-11-22 ENCOUNTER — Other Ambulatory Visit: Payer: Self-pay

## 2018-11-22 ENCOUNTER — Encounter: Payer: Self-pay | Admitting: Podiatry

## 2018-11-22 ENCOUNTER — Ambulatory Visit (INDEPENDENT_AMBULATORY_CARE_PROVIDER_SITE_OTHER): Payer: Medicare Other | Admitting: Podiatry

## 2018-11-22 DIAGNOSIS — B351 Tinea unguium: Secondary | ICD-10-CM | POA: Diagnosis not present

## 2018-11-22 DIAGNOSIS — M79675 Pain in left toe(s): Secondary | ICD-10-CM

## 2018-11-22 DIAGNOSIS — M79674 Pain in right toe(s): Secondary | ICD-10-CM | POA: Diagnosis not present

## 2018-11-22 NOTE — Progress Notes (Signed)
Complaint:  Visit Type: Patient returns to my office for continued preventative foot care services. Complaint: Patient states" my nails have grown long and thick and become painful to walk and wear shoes".  Patient also has a painful callus outside border left foot..  The patient presents for preventative foot care services. No changes to ROS.  Patient has not been seen in 7 months.  Podiatric Exam: Vascular: dorsalis pedis  are palpable bilateral. Posterior tibial pulses are absent  B/L. Capillary return is immediate. Temperature gradient is WNL. Skin turgor WNL  Sensorium: Normal Semmes Weinstein monofilament test. Normal tactile sensation bilaterally. Nail Exam: Pt has thick disfigured discolored nails with subungual debris noted bilateral entire nail hallux through fifth toenails Ulcer Exam: There is no evidence of ulcer or pre-ulcerative changes or infection. Orthopedic Exam: Muscle tone and strength are WNL. No limitations in general ROM. No crepitus or effusions noted.  Contracted digits  Right foot.. Bony prominences are unremarkable. Skin: No Porokeratosis. No infection or ulcers.  Diffuse callus sub 5th metatabase left foot.  Diagnosis:  Onychomycosis, , Pain in right toe, pain in left toes  Callus left foot.  Treatment & Plan Procedures and Treatment: Consent by patient was obtained for treatment procedures.   Debridement of mycotic and hypertrophic toenails, 1 through 5 bilateral and clearing of subungual debris. No ulceration, no infection noted. Debride callus left foot. Patient was very sensitive working on her toes.   Return Visit-Office Procedure: Patient instructed to return to the office for a follow up visit 3 months for continued evaluation and treatment.    Gardiner Barefoot DPM

## 2018-12-10 IMAGING — CT CT HEAD W/O CM
3 series · 14 of 47 positions shown, 16 images · non-contrast
Comparison: Head CT dated 04/25/2010.

CLINICAL DATA: Dizziness today, nausea and headache.

EXAM:
CT HEAD WITHOUT CONTRAST
TECHNIQUE: Contiguous axial images were obtained from the base of the skull
through the vertex without intravenous contrast.

[Series 2: head wo · axial · 0.47mm/px · z∈[-127,-2]mm · 8 of 30 slices shown, 10 images]
[im 3/30  brain]
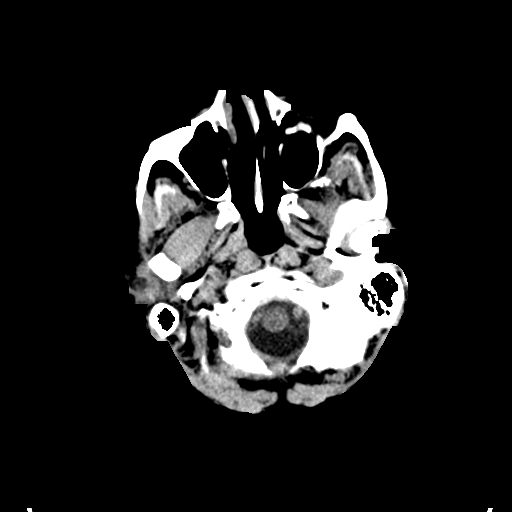
[im 3/30  bone]
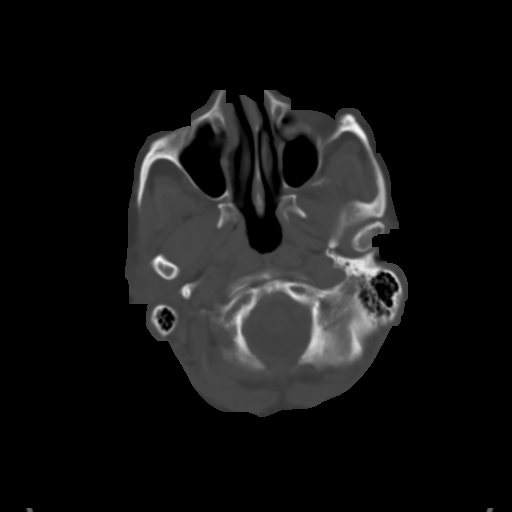
[im 7/30  brain]
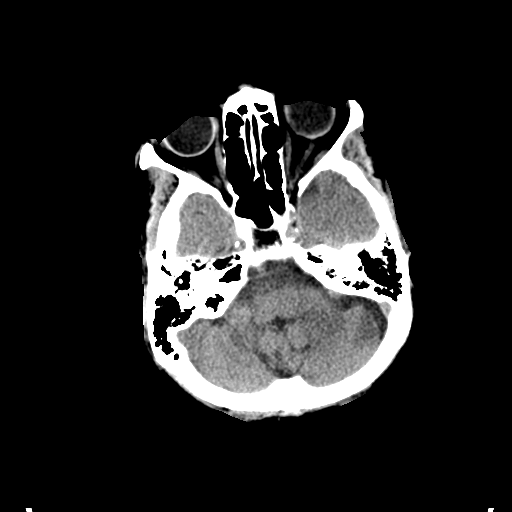
[im 10/30  brain]
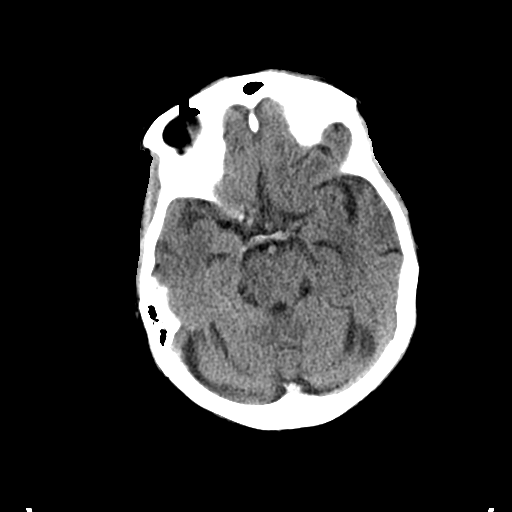
[im 14/30  brain]
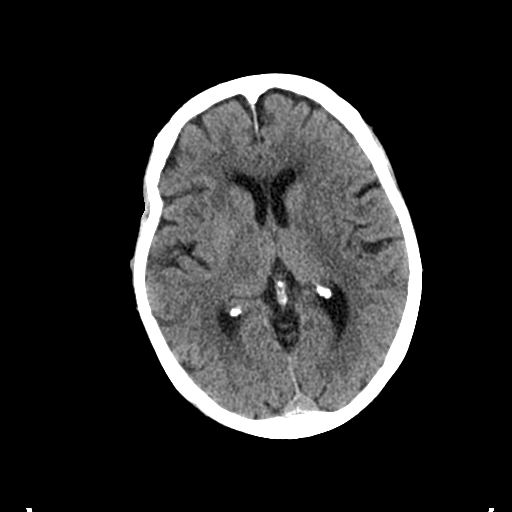
[im 17/30  brain]
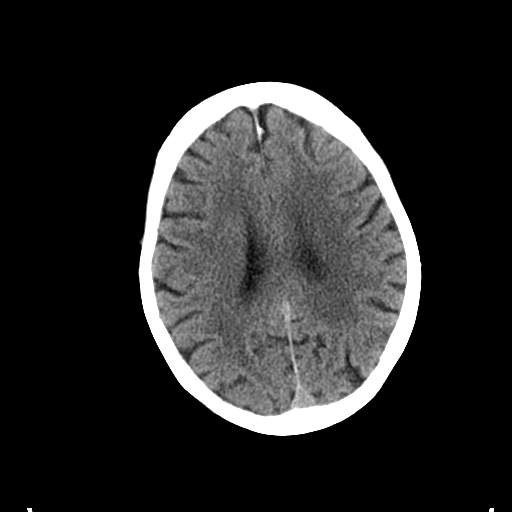
[im 17/30  bone]
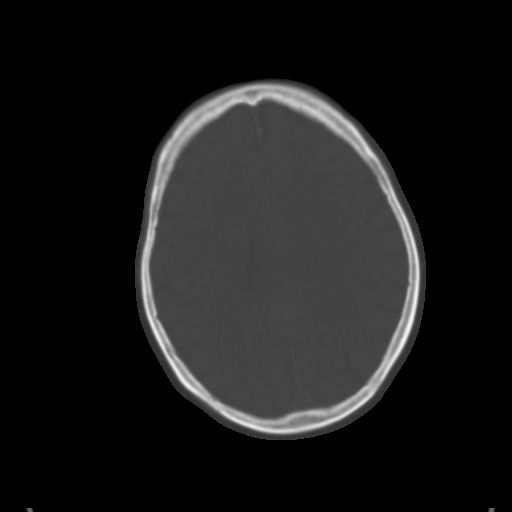
[im 21/30  brain]
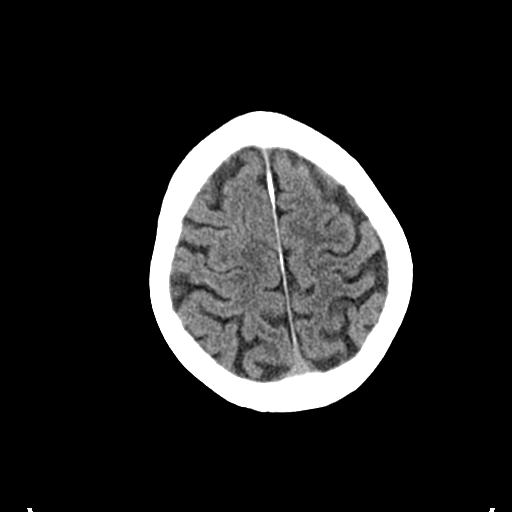
[im 24/30  brain]
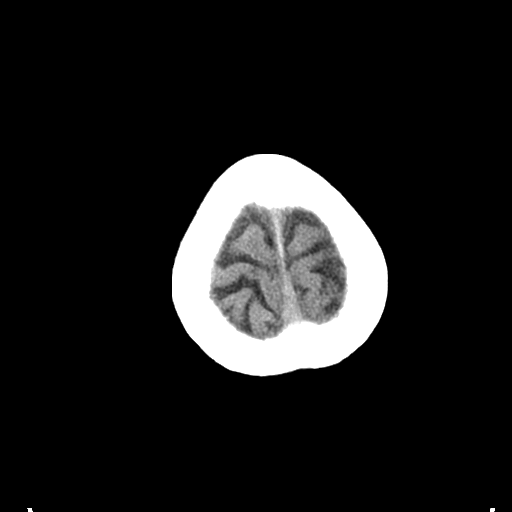
[im 28/30  brain]
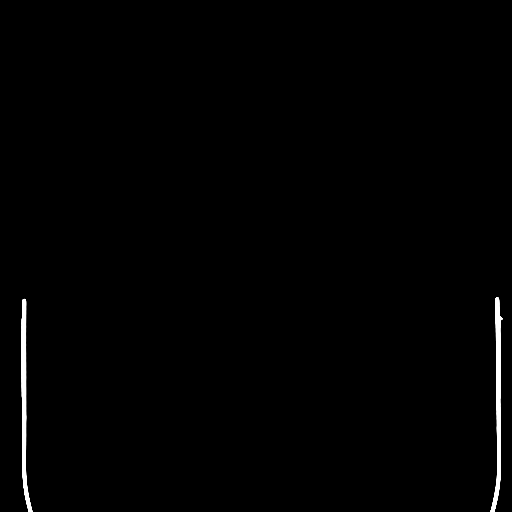

[Series 5: coronal soft tissue · coronal · 0.28mm/px · 3 of 67 slices shown]
[im 23/67  brain]
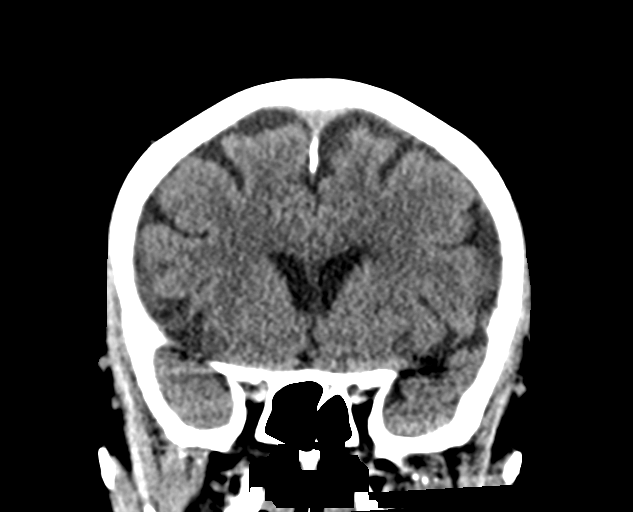
[im 30/67  brain]
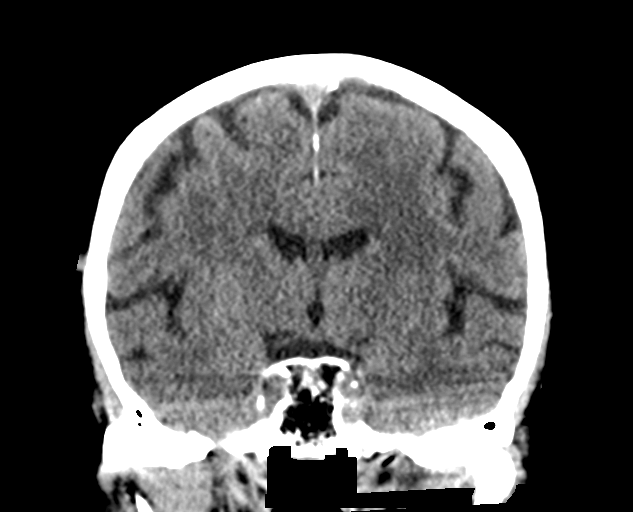
[im 37/67  brain]
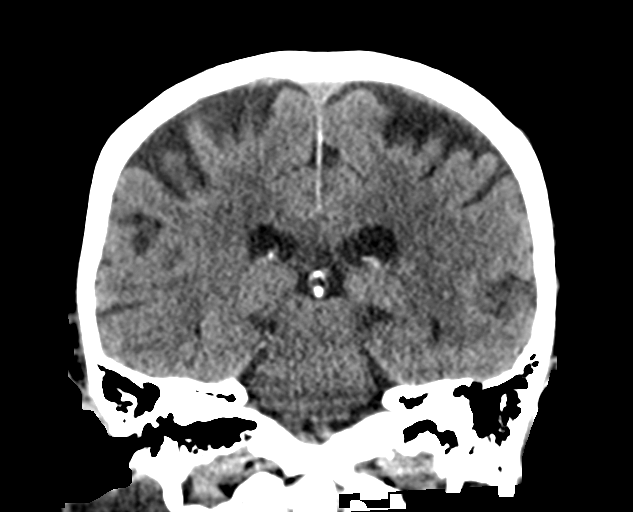

[Series 6: sagittal soft tissue · sagittal · 0.29mm/px · 3 of 59 slices shown]
[im 20/59  brain]
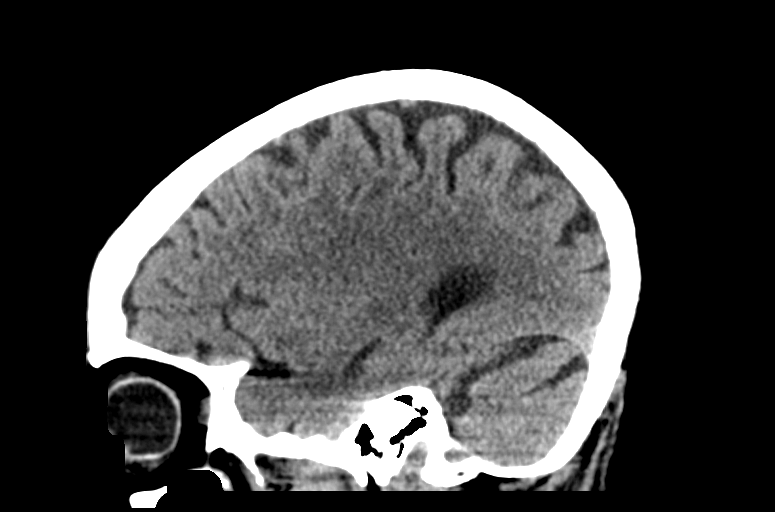
[im 30/59  brain]
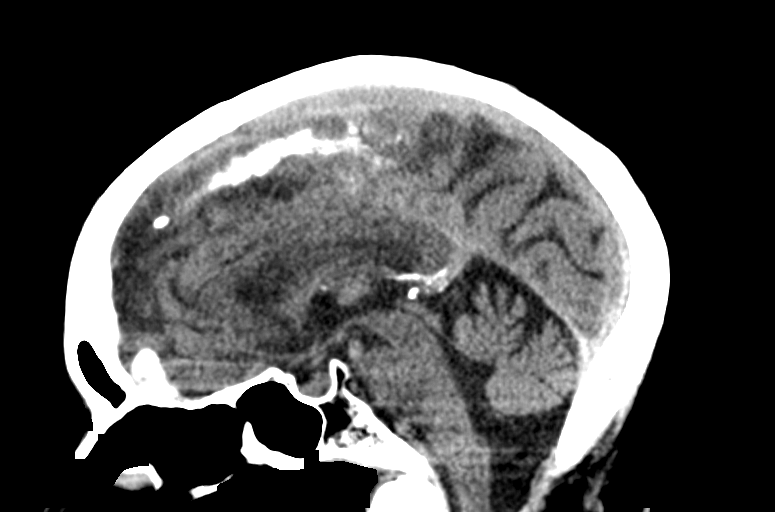
[im 39/59  brain]
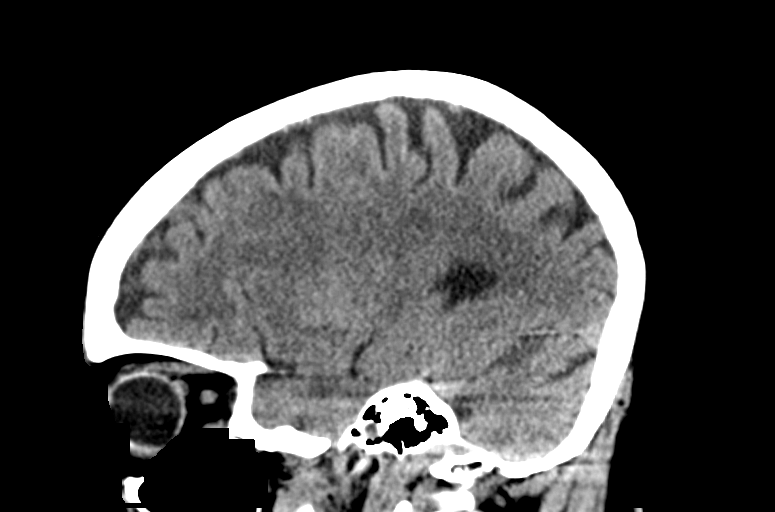

[14 of 47 positions shown; findings below may reference images not displayed]

FINDINGS: Brain: No hydrocephalus. Mild chronic small vessel ischemic changes
within the periventricular white matter.

No mass, hemorrhage, edema or other evidence of acute parenchymal
abnormality. No extra-axial hemorrhage.

Vascular: There are chronic calcified atherosclerotic changes of the
large vessels at the skull base. No unexpected hyperdense vessel.

Skull: Normal. Negative for fracture or focal lesion.

Sinuses/Orbits: No acute finding.

Other: None.
IMPRESSION: 1. No acute findings.  No intracranial mass, hemorrhage or edema.
2. Mild chronic small vessel ischemic change within the white
matter.

## 2019-01-14 DIAGNOSIS — K219 Gastro-esophageal reflux disease without esophagitis: Secondary | ICD-10-CM | POA: Insufficient documentation

## 2019-03-19 ENCOUNTER — Ambulatory Visit (INDEPENDENT_AMBULATORY_CARE_PROVIDER_SITE_OTHER): Payer: Medicare Other | Admitting: Podiatry

## 2019-03-19 ENCOUNTER — Ambulatory Visit: Payer: Medicare Other | Admitting: Podiatry

## 2019-03-19 ENCOUNTER — Encounter: Payer: Self-pay | Admitting: Podiatry

## 2019-03-19 ENCOUNTER — Other Ambulatory Visit: Payer: Self-pay

## 2019-03-19 DIAGNOSIS — L89610 Pressure ulcer of right heel, unstageable: Secondary | ICD-10-CM

## 2019-03-19 DIAGNOSIS — M79671 Pain in right foot: Secondary | ICD-10-CM

## 2019-03-19 DIAGNOSIS — M79674 Pain in right toe(s): Secondary | ICD-10-CM | POA: Diagnosis not present

## 2019-03-19 DIAGNOSIS — M79675 Pain in left toe(s): Secondary | ICD-10-CM | POA: Diagnosis not present

## 2019-03-19 DIAGNOSIS — L84 Corns and callosities: Secondary | ICD-10-CM

## 2019-03-19 DIAGNOSIS — B351 Tinea unguium: Secondary | ICD-10-CM | POA: Diagnosis not present

## 2019-03-19 DIAGNOSIS — M79672 Pain in left foot: Secondary | ICD-10-CM

## 2019-03-19 DIAGNOSIS — L89620 Pressure ulcer of left heel, unstageable: Secondary | ICD-10-CM

## 2019-03-19 NOTE — Patient Instructions (Addendum)
Corns and Calluses Corns are small areas of thickened skin that occur on the top, sides, or tip of a toe. They contain a cone-shaped core with a point that can press on a nerve below. This causes pain.  Calluses are areas of thickened skin that can occur anywhere on the body, including the hands, fingers, palms, soles of the feet, and heels. Calluses are usually larger than corns. What are the causes? Corns and calluses are caused by rubbing (friction) or pressure, such as from shoes that are too tight or do not fit properly. What increases the risk? Corns are more likely to develop in people who have misshapen toes (toe deformities), such as hammer toes. Calluses can occur with friction to any area of the skin. They are more likely to develop in people who:  Work with their hands.  Wear shoes that fit poorly, are too tight, or are high-heeled.  Have toe deformities. What are the signs or symptoms? Symptoms of a corn or callus include:  A hard growth on the skin.  Pain or tenderness under the skin.  Redness and swelling.  Increased discomfort while wearing tight-fitting shoes, if your feet are affected. If a corn or callus becomes infected, symptoms may include:  Redness and swelling that gets worse.  Pain.  Fluid, blood, or pus draining from the corn or callus. How is this diagnosed? Corns and calluses may be diagnosed based on your symptoms, your medical history, and a physical exam. How is this treated? Treatment for corns and calluses may include:  Removing the cause of the friction or pressure. This may involve: ? Changing your shoes. ? Wearing shoe inserts (orthotics) or other protective layers in your shoes, such as a corn pad. ? Wearing gloves.  Applying medicine to the skin (topical medicine) to help soften skin in the hardened, thickened areas.  Removing layers of dead skin with a file to reduce the size of the corn or callus.  Removing the corn or callus with a  scalpel or laser.  Taking antibiotic medicines, if your corn or callus is infected.  Having surgery, if a toe deformity is the cause. Follow these instructions at home:   Take over-the-counter and prescription medicines only as told by your health care provider.  If you were prescribed an antibiotic, take it as told by your health care provider. Do not stop taking it even if your condition starts to improve.  Wear shoes that fit well. Avoid wearing high-heeled shoes and shoes that are too tight or too loose.  Wear any padding, protective layers, gloves, or orthotics as told by your health care provider.  Soak your hands or feet and then use a file or pumice stone to soften your corn or callus. Do this as told by your health care provider.  Check your corn or callus every day for symptoms of infection. Contact a health care provider if you:  Notice that your symptoms do not improve with treatment.  Have redness or swelling that gets worse.  Notice that your corn or callus becomes painful.  Have fluid, blood, or pus coming from your corn or callus.  Have new symptoms. Summary  Corns are small areas of thickened skin that occur on the top, sides, or tip of a toe.  Calluses are areas of thickened skin that can occur anywhere on the body, including the hands, fingers, palms, and soles of the feet. Calluses are usually larger than corns.  Corns and calluses are caused by   rubbing (friction) or pressure, such as from shoes that are too tight or do not fit properly.  Treatment may include wearing any padding, protective layers, gloves, or orthotics as told by your health care provider. This information is not intended to replace advice given to you by your health care provider. Make sure you discuss any questions you have with your health care provider. Document Released: 12/18/2003 Document Revised: 07/03/2018 Document Reviewed: 01/24/2017 Elsevier Patient Education  2020 [employer]   Onychomycosis/Fungal Toenails  WHAT IS IT? An infection that lies within the keratin of your nail plate that is caused by a fungus.  WHY ME? Fungal infections affect all ages, sexes, races, and creeds.  There may be many factors that predispose you to a fungal infection such as age, coexisting medical conditions such as diabetes, or an autoimmune disease; stress, medications, fatigue, genetics, etc.  Bottom line: fungus thrives in a warm, moist environment and your shoes offer such a location.  IS IT CONTAGIOUS? Theoretically, yes.  You do not want to share shoes, nail clippers or files with someone who has fungal toenails.  Walking around barefoot in the same room or sleeping in the same bed is unlikely to transfer the organism.  It is important to realize, however, that fungus can spread easily from one nail to the next on the same foot.  HOW DO WE TREAT THIS?  There are several ways to treat this condition.  Treatment may depend on many factors such as age, medications, pregnancy, liver and kidney conditions, etc.  It is best to ask your doctor which options are available to you.  1. No treatment.   Unlike many other medical concerns, you can live with this condition.  However for many people this can be a painful condition and may lead to ingrown toenails or a bacterial infection.  It is recommended that you keep the nails cut short to help reduce the amount of fungal nail. 2. Topical treatment.  These range from herbal remedies to prescription strength nail lacquers.  About 40-50% effective, topicals require twice daily application for approximately 9 to 12 months or until an entirely new nail has grown out.  The most effective topicals are medical grade medications available through physicians offices. 3. Oral antifungal medications.  With an 80-90% cure rate, the most common oral medication requires 3 to 4 months of therapy and stays in your system for a year as the new nail grows out.   Oral antifungal medications do require blood work to make sure it is a safe drug for you.  A liver function panel will be performed prior to starting the medication and after the first month of treatment.  It is important to have the blood work performed to avoid any harmful side effects.  In general, this medication safe but blood work is required. 4. Laser Therapy.  This treatment is performed by applying a specialized laser to the affected nail plate.  This therapy is noninvasive, fast, and non-painful.  It is not covered by insurance and is therefore, out of pocket.  The results have been very good with a 80-95% cure rate.  [facility] is the only practice in the area to offer this therapy. 5. Permanent Nail Avulsion.  Removing the entire nail so that a new nail will not grow back. 

## 2019-03-24 NOTE — Progress Notes (Signed)
Subjective: Monique Young presents today with painful, thick toenails 1-5 b/l that she cannot cut and which interfere with daily activities.  Pain is aggravated when wearing enclosed shoe gear.  She relates tenderness to posterior heels b/l. She states she sleeps on her back mostly.   Bernerd Limbo, MD is her PCP.   Current Outpatient Medications on File Prior to Visit  Medication Sig Dispense Refill  . Acetaminophen (TYLENOL PO) Take 1 tablet by mouth as needed.    . Bacillus Coagulans-Inulin (ALIGN PREBIOTIC-PROBIOTIC PO) Take by mouth.    . cetirizine (ZYRTEC) 10 MG chewable tablet Chew 10 mg by mouth daily.    . diphenoxylate-atropine (LOMOTIL) 2.5-0.025 MG tablet Take by mouth.    . fish oil-omega-3 fatty acids 1000 MG capsule Take 2 g by mouth daily.    . fluticasone (FLONASE) 50 MCG/ACT nasal spray Place into both nostrils daily.    . IRON PO Take by mouth.    . loperamide (IMODIUM A-D) 2 MG tablet Take by mouth.    . Multiple Vitamin (MULTIVITAMIN) tablet Take by mouth.     No current facility-administered medications on file prior to visit.    Allergies  Allergen Reactions  . Iodine   . Sulfa Antibiotics   . Sulfonamide Derivatives     Objective: There were no vitals filed for this visit.  Vascular Examination: Capillary refill time to digits <4 seconds b/l.  Dorsalis pedis pulses faintly palpable b/l.   Posterior tibial pulses nonpalpable b/l  Digital hair absent b/l.  Skin temperature gradient WNL b/l  Dermatological Examination: Skin with normal turgor, texture and tone b/l  Toenails 1-5 b/l discolored, thick, dystrophic with subungual debris and pain with palpation to nailbeds due to thickness of nails.  Plaque posterior heels b/l and sub 5th met base left foot. +Tenderness to posterior heels. No erythema, no edema, no drainage, no flocculence, no warmth.  Musculoskeletal: Muscle strength 5/5 to all LE muscle groups  Contracted lesser digits.   No  pain, crepitus or joint limitation noted with ROM.   Neurological: Sensation intact with 10 gram monofilament.  Vibratory sensation intact.  Assessment: Painful onychomycosis toenails 1-5 b/l  Early pressure spots b/l heels Callus left foot  Plan: 1. Toenails 1-5 b/l were debrided in length and girth without iatrogenic bleeding.  2. Calluses pared sub 5th met base left foot and heels utilizing sterile scalpel blade without incident. 3. Recommended she wear heel pillows b/l feet when in bed daily for protection. Instructed not to wear heel pillows when going to the bathroom. She related understanding. She cannot get them today, but will come back and purchase them. 4. Patient to continue soft, supportive shoe gear daily. 5. Patient to report any pedal injuries to medical professional immediately. 6. Follow up 4 months, sooner if she has any problems.  7. Patient/POA to call should there be a concern in the interim.

## 2019-04-17 ENCOUNTER — Ambulatory Visit: Payer: Medicare Other | Attending: Internal Medicine

## 2019-04-17 DIAGNOSIS — Z23 Encounter for immunization: Secondary | ICD-10-CM | POA: Insufficient documentation

## 2019-04-17 NOTE — Progress Notes (Signed)
   Covid-19 Vaccination Clinic  Name:  Monique Young    MRN: [id number] DOB: May 31, 1930  04/17/2019  [name] was observed post Covid-19 immunization for 15 minutes without incidence. She was provided with Vaccine Information Sheet and instruction to access the V-Safe system.   [name] was instructed to call 911 with any severe reactions post vaccine: Marland Kitchen Difficulty breathing  . Swelling of your face and throat  . A fast heartbeat  . A bad rash all over your body  . Dizziness and weakness    Immunizations Administered    Name Date Dose VIS Date Route   Pfizer COVID-19 Vaccine 04/17/2019 10:44 AM 0.3 mL 03/07/2019 Intramuscular   Manufacturer: [employer], [employer]   Lot: [id number]   NDC: [address]-4

## 2019-05-08 ENCOUNTER — Ambulatory Visit: Payer: Medicare Other | Attending: Internal Medicine

## 2019-05-08 DIAGNOSIS — Z23 Encounter for immunization: Secondary | ICD-10-CM | POA: Insufficient documentation

## 2019-05-08 NOTE — Progress Notes (Signed)
   Covid-19 Vaccination Clinic  Name:  Monique Young    MRN: [id number] DOB: 07-Mar-1931  05/08/2019  [name] was observed post Covid-19 immunization for 15 minutes without incidence. She was provided with Vaccine Information Sheet and instruction to access the V-Safe system.   [name] was instructed to call 911 with any severe reactions post vaccine: Marland Kitchen Difficulty breathing  . Swelling of your face and throat  . A fast heartbeat  . A bad rash all over your body  . Dizziness and weakness    Immunizations Administered    Name Date Dose VIS Date Route   Pfizer COVID-19 Vaccine 05/08/2019 11:40 AM 0.3 mL 03/07/2019 Intramuscular   Manufacturer: [employer], [employer]   Lot: [id number]   NDC: [address]-0

## 2019-07-16 ENCOUNTER — Ambulatory Visit (INDEPENDENT_AMBULATORY_CARE_PROVIDER_SITE_OTHER): Payer: Medicare Other | Admitting: Podiatry

## 2019-07-16 ENCOUNTER — Other Ambulatory Visit: Payer: Self-pay

## 2019-07-16 ENCOUNTER — Encounter: Payer: Self-pay | Admitting: Podiatry

## 2019-07-16 VITALS — Temp 96.3°F

## 2019-07-16 DIAGNOSIS — L89620 Pressure ulcer of left heel, unstageable: Secondary | ICD-10-CM

## 2019-07-16 DIAGNOSIS — B351 Tinea unguium: Secondary | ICD-10-CM | POA: Diagnosis not present

## 2019-07-16 DIAGNOSIS — M79674 Pain in right toe(s): Secondary | ICD-10-CM

## 2019-07-16 DIAGNOSIS — L84 Corns and callosities: Secondary | ICD-10-CM

## 2019-07-16 DIAGNOSIS — M79675 Pain in left toe(s): Secondary | ICD-10-CM

## 2019-07-16 DIAGNOSIS — I739 Peripheral vascular disease, unspecified: Secondary | ICD-10-CM

## 2019-07-16 DIAGNOSIS — L89610 Pressure ulcer of right heel, unstageable: Secondary | ICD-10-CM

## 2019-07-16 NOTE — Patient Instructions (Addendum)
WEAR HEEL PILLOWS WHENEVER YOU ARE IN BED. REMOVE THEM IF YOU HAVE TO GO TO THE BATHROOM.  DO NOT WEAR HEEL PROTECTORS WHEN WALKING.   Pressure Injury  A pressure injury is damage to the skin and underlying tissue that results from pressure being applied to an area of the body. It often affects people who must spend a long time in a bed or chair because of a medical condition. Pressure injuries usually occur:  Over bony parts of the body, such as the tailbone, shoulders, elbows, hips, heels, spine, ankles, and back of the head.  Under medical devices that make contact with the body, such as respiratory equipment, stockings, tubes, and splints. Pressure injuries start as reddened areas on the skin and can lead to pain and an open wound. What are the causes? This condition is caused by frequent or constant pressure to an area of the body. Decreased blood flow to the skin can eventually cause the skin tissue to die and break down, causing a wound. What increases the risk? You are more likely to develop this condition if you:  Are in the hospital or an extended care facility.  Are bedridden or in a wheelchair.  Have an injury or disease that keeps you from: ? Moving normally. ? Feeling pain or pressure.  Have a condition that: ? Makes you sleepy or less alert. ? Causes poor blood flow.  Need to wear a medical device.  Have poor control of your bladder or bowel functions (incontinence).  Have poor nutrition (malnutrition). If you are at risk for pressure injuries, your health care provider may recommend certain types of mattresses, mattress covers, pillows, cushions, or boots to help prevent them. These may include products filled with air, foam, gel, or sand. What are the signs or symptoms? Symptoms of this condition depend on the severity of the injury. Symptoms may include:  Red or dark areas of the skin.  Pain, warmth, or a change of skin texture.  Blisters.  An open  wound. How is this diagnosed? This condition is diagnosed with a medical history and physical exam. You may also have tests, such as:  Blood tests.  Imaging tests.  Blood flow tests. Your pressure injury will be staged based on its severity. Staging is based on:  The depth of the tissue injury, including whether there is exposure of muscle, bone, or tendon.  The cause of the pressure injury. How is this treated? This condition may be treated by:  Relieving or redistributing pressure on your skin. This includes: ? Frequently changing your position. ? Avoiding positions that caused the wound or that can make the wound worse. ? Using specific bed mattresses, chair cushions, or protective boots. ? Moving medical devices from an area of pressure, or placing padding between the skin and the device. ? Using foams, creams, or powders to prevent rubbing (friction) on the skin.  Keeping your skin clean and dry. This may include using a skin cleanser or skin barrier as told by your health care provider.  Cleaning your injury and removing any dead tissue from the wound (debridement).  Placing a bandage (dressing) over your injury.  Using medicines for pain or to prevent or treat infection. Surgery may be needed if other treatments are not working or if your injury is very deep. Follow these instructions at home: Wound care  Follow instructions from your health care provider about how to take care of your wound. Make sure you: ? Wash your hands  with soap and water before and after you change your bandage (dressing). If soap and water are not available, use hand sanitizer. ? Change your dressing as told by your health care provider.  Check your wound every day for signs of infection. Have a caregiver do this for you if you are not able. Check for: ? Redness, swelling, or increased pain. ? More fluid or blood. ? Warmth. ? Pus or a bad smell. Skin care  Keep your skin clean and dry.  Gently pat your skin dry.  Do not rub or massage your skin.  You or a caregiver should check your skin every day for any changes in color or any new blisters or sores (ulcers). Medicines  Take over-the-counter and prescription medicines only as told by your health care provider.  If you were prescribed an antibiotic medicine, take or apply it as told by your health care provider. Do not stop using the antibiotic even if your condition improves. Reducing and redistributing pressure  Do not lie or sit in one position for a long time. Move or change position every 1-2 hours, or as told by your health care provider.  Use pillows or cushions to reduce pressure. Ask your health care provider to recommend cushions or pads for you. General instructions   Eat a healthy diet that includes lots of protein.  Drink enough fluid to keep your urine pale yellow.  Be as active as you can every day. Ask your health care provider to suggest safe exercises or activities.  Do not abuse drugs or alcohol.  Do not use any products that contain nicotine or tobacco, such as cigarettes, e-cigarettes, and chewing tobacco. If you need help quitting, ask your health care provider.  Keep all follow-up visits as told by your health care provider. This is important. Contact a health care provider if:  You have: ? A fever or chills. ? Pain that is not helped by medicine. ? Any changes in skin color. ? New blisters or sores. ? Pus or a bad smell coming from your wound. ? Redness, swelling, or pain around your wound. ? More fluid or blood coming from your wound.  Your wound does not improve after 1-2 weeks of treatment. Summary  A pressure injury is damage to the skin and underlying tissue that results from pressure being applied to an area of the body.  Do not lie or sit in one position for a long time. Your health care provider may advise you to move or change position every 1-2 hours.  Follow  instructions from your health care provider about how to take care of your wound.  Keep all follow-up visits as told by your health care provider. This is important. This information is not intended to replace advice given to you by your health care provider. Make sure you discuss any questions you have with your health care provider. Document Revised: 10/10/2017 Document Reviewed: 10/10/2017 Elsevier Patient Education  2020 [employer]   Peripheral Vascular Disease Peripheral vascular disease (PVD) is a disease of the blood vessels. A simple term for PVD is poor circulation. In most cases, PVD narrows the blood vessels that carry blood from your heart to the rest of your body. This can result in a decreased supply of blood to your arms, legs, and internal organs, like your stomach or kidneys. However, it most often affects a person's lower legs and feet. There are two types of PVD.  Organic PVD. This is the more common  type. It is caused by damage to the structure of blood vessels.  Functional PVD. This is caused by conditions that make blood vessels contract and tighten (spasm). Without treatment, PVD tends to get worse over time. PVD can also lead to acute limb ischemia. This is when an arm or leg suddenly has trouble getting enough blood. This is a medical emergency. What are the causes?  Each type of PVD has many different causes. The most common cause of PVD is buildup of a fatty material (plaque) inside your arteries (atherosclerosis). Small amounts of plaque can break off from the walls of the blood vessels and become lodged in a smaller artery. This blocks blood flow and can cause acute limb ischemia. Other common causes of PVD include:  Blood clots that form inside of blood vessels.  Injuries to blood vessels.  Diseases that cause inflammation of blood vessels or cause blood vessel spasms.  Health behaviors and health history that increase your risk of developing PVD. What  increases the risk? You are more likely to develop this condition if:  You have a family history of PVD.  You have certain medical conditions, including: ? High cholesterol. ? Diabetes. ? High blood pressure (hypertension). ? Coronary heart disease. ? Past problems with blood clots. ? Past injury, such as burns or a broken bone. These may have damaged blood vessels in your limbs. ? Buerger disease. This is caused by inflamed blood vessels in your hands and feet. ? Some forms of arthritis. ? Rare birth defects that affect the arteries in your legs. ? Kidney disease.  You use tobacco or smoke.  You do not get enough exercise.  You are obese.  You are age 54 or older. What are the signs or symptoms? This condition may cause different symptoms. Your symptoms depend on what part of your body is not getting enough blood. Some common signs and symptoms include:  Cramps in your lower legs. This may be a symptom of poor leg circulation (claudication).  Pain and weakness in your legs. This happens while you are physically active but goes away when you rest (intermittent claudication).  Leg pain when at rest.  Leg numbness, tingling, or weakness.  Coldness in a leg or foot, especially when compared with the other leg.  Skin or hair changes. These can include: ? Hair loss. ? Shiny skin. ? Pale or bluish skin. ? Thick toenails.  Inability to get or maintain an erection (erectile dysfunction).  Fatigue. People with PVD are more likely to develop ulcers and sores on their toes, feet, or legs. These may take longer than normal to heal. How is this diagnosed? This condition is diagnosed based on:  Your signs and symptoms.  A physical exam and your medical history.  Other tests to find out what is causing your PVD and to determine its severity. Tests may include: ? Blood pressure recordings from your arms and legs and measurements of the strength of your pulses (pulse volume  recordings). ? Imaging studies using sound waves to take pictures of the blood flow through your blood vessels (Doppler ultrasound). ? Injecting a dye into your blood vessels before having imaging studies using:  X-rays (angiogram or arteriogram).  Computer-generated X-rays (CT angiogram).  A powerful electromagnetic field and a computer (magnetic resonance angiogram or MRA). How is this treated? Treatment for PVD depends on the cause of your condition and how severe your symptoms are. It also depends on your age. Underlying causes need to be  treated and controlled. These include long-term (chronic) conditions, such as diabetes, high cholesterol, and high blood pressure. Treatment includes:  Lifestyle changes, such as: ? Quitting smoking. ? Exercising regularly. ? Following a low-fat, low-cholesterol diet.  Taking medicines, such as: ? Blood thinners to prevent blood clots. ? Medicines to improve blood flow. ? Medicines to improve your blood cholesterol levels.  Surgical procedures, such as: ? A procedure that uses an inflated balloon to open a blocked artery and improve blood flow (angioplasty). ? A procedure to put in a wire mesh tube to keep a blocked artery open (stent implant). ? Surgery to reroute blood flow around a blocked artery (peripheral bypass surgery). ? Surgery to remove dead tissue from an infected wound on the affected limb. ? Amputation. This is surgical removal of the affected limb. It may be necessary in cases of acute limb ischemia where there has been no improvement through medical or surgical treatments. Follow these instructions at home: Lifestyle  Do not use any products that contain nicotine or tobacco, such as cigarettes and e-cigarettes. If you need help quitting, ask your health care provider.  Lose weight if you are overweight, and maintain a healthy weight as discussed by your health care provider.  Eat a diet that is low in fat and cholesterol. If  you need help, ask your health care provider.  Exercise regularly. Ask your health care provider to suggest some good activities for you. General instructions  Take over-the-counter and prescription medicines only as told by your health care provider.  Take good care of your feet: ? Wear comfortable shoes that fit well. ? Check your feet often for any cuts or sores.  Keep all follow-up visits as told by your health care provider. This is important. Contact a health care provider if:  You have cramps in your legs while walking.  You have leg pain when you are at rest.  You have coldness in a leg or foot.  Your skin changes.  You have erectile dysfunction.  You have cuts or sores on your feet that are not healing. Get help right away if:  Your arm or leg turns cold, numb, and blue.  Your arms or legs become red, warm, swollen, painful, or numb.  You have chest pain or trouble breathing.  You suddenly have weakness in your face, arm, or leg.  You become very confused or lose the ability to speak.  You suddenly have a very bad headache or lose your vision. Summary  Peripheral vascular disease (PVD) is a disease of the blood vessels.  In most cases, PVD narrows the blood vessels that carry blood from your heart to the rest of your body.  PVD may cause different symptoms. Your symptoms depend on what part of your body is not getting enough blood.  Treatment for PVD depends on the cause of your condition and how severe your symptoms are. This information is not intended to replace advice given to you by your health care provider. Make sure you discuss any questions you have with your health care provider. Document Revised: 02/23/2017 Document Reviewed: 04/20/2016 Elsevier Patient Education  2020 [employer]  Onychomycosis/Fungal Toenails  WHAT IS IT? An infection that lies within the keratin of your nail plate that is caused by a fungus.  WHY ME? Fungal infections  affect all ages, sexes, races, and creeds.  There may be many factors that predispose you to a fungal infection such as age, coexisting medical conditions such as diabetes,  or an autoimmune disease; stress, medications, fatigue, genetics, etc.  Bottom line: fungus thrives in a warm, moist environment and your shoes offer such a location.  IS IT CONTAGIOUS? Theoretically, yes.  You do not want to share shoes, nail clippers or files with someone who has fungal toenails.  Walking around barefoot in the same room or sleeping in the same bed is unlikely to transfer the organism.  It is important to realize, however, that fungus can spread easily from one nail to the next on the same foot.  HOW DO WE TREAT THIS?  There are several ways to treat this condition.  Treatment may depend on many factors such as age, medications, pregnancy, liver and kidney conditions, etc.  It is best to ask your doctor which options are available to you.  5. No treatment.   Unlike many other medical concerns, you can live with this condition.  However for many people this can be a painful condition and may lead to ingrown toenails or a bacterial infection.  It is recommended that you keep the nails cut short to help reduce the amount of fungal nail. 6. Topical treatment.  These range from herbal remedies to prescription strength nail lacquers.  About 40-50% effective, topicals require twice daily application for approximately 9 to 12 months or until an entirely new nail has grown out.  The most effective topicals are medical grade medications available through physicians offices. 7. Oral antifungal medications.  With an 80-90% cure rate, the most common oral medication requires 3 to 4 months of therapy and stays in your system for a year as the new nail grows out.  Oral antifungal medications do require blood work to make sure it is a safe drug for you.  A liver function panel will be performed prior to starting the medication and  after the first month of treatment.  It is important to have the blood work performed to avoid any harmful side effects.  In general, this medication safe but blood work is required. 8. Laser Therapy.  This treatment is performed by applying a specialized laser to the affected nail plate.  This therapy is noninvasive, fast, and non-painful.  It is not covered by insurance and is therefore, out of pocket.  The results have been very good with a 80-95% cure rate.  [facility] is the only practice in the area to offer this therapy. 9. Permanent Nail Avulsion.  Removing the entire nail so that a new nail will not grow back.

## 2019-07-16 NOTE — Progress Notes (Signed)
Subjective: Monique Young presents today for follow up of for at risk foot care. Patient has h/o PAD and callus(es) b/l feet and painful mycotic toenails b/l that are difficult to trim. Pain interferes with ambulation. Aggravating factors include wearing enclosed shoe gear. Pain is relieved with periodic professional debridement.   She relates she never received a phone call regarding restocking of the heel pillows for the pressure areas she has on both heels.   Allergies  Allergen Reactions  . Iodine   . Sulfa Antibiotics   . Sulfonamide Derivatives      Objective: Vitals:   07/16/19 1152  Temp: (!) 96.3 F (35.7 C)    Pt is a pleasant 86 y.o. year old AA female  in NAD. AAO x 3.   Vascular Examination:  Capillary refill time to digits <4 seconds b/l. Faintly palpable DP pulses b/l. Nonpalpable PT pulses b/l. Pedal hair absent b/l Skin temperature gradient within normal limits b/l.  Dermatological Examination: Pedal skin with normal turgor, texture and tone bilaterally. No open wounds bilaterally. No interdigital macerations bilaterally. Toenails 1-5 b/l elongated, dystrophic, thickened, crumbly with subungual debris and tenderness to dorsal palpation. Hyperkeratotic lesion(s) posterior aspect b/l heels and sub 5th met base left foot.  No erythema, no edema, no drainage, no flocculence.  Musculoskeletal: Normal muscle strength 5/5 to all lower extremity muscle groups bilaterally, no pain crepitus or joint limitation noted with ROM b/l and hammertoes noted to the  2-5 bilaterally  Neurological: Protective sensation intact 5/5 intact with 10g monofilament b/l. Vibratory sensation intact b/l. Clonus negative b/l.  Assessment: 1. Pain due to onychomycosis of toenails of both feet   2. Callus   3. Decubitus ulcer of both heels, unstageable ([place])   4. PVD (peripheral vascular disease) ([facility])     Plan: -Toenails 1-5 b/l were debrided in length and girth with sterile nail nippers  and dremel without iatrogenic bleeding.  -Callus(es) b/l heels and sub 5th met base left foot were debrided without complication or incident. Total number debrided =3. -Dispensed heel pillows for her to wear when in bed daily. Patient warned not to attempt to walk with these on. She related understanding. -Patient to continue soft, supportive shoe gear daily. -Patient to report any pedal injuries to medical professional immediately. -Patient/POA to call should there be question/concern in the interim.  Return in about 3 months (around 10/15/2019) for nail trim.

## 2019-10-17 ENCOUNTER — Ambulatory Visit: Payer: Medicare Other | Admitting: Podiatry

## 2020-09-01 ENCOUNTER — Encounter: Payer: Self-pay | Admitting: Podiatry

## 2020-09-01 ENCOUNTER — Ambulatory Visit (INDEPENDENT_AMBULATORY_CARE_PROVIDER_SITE_OTHER): Payer: Medicare Other | Admitting: Podiatry

## 2020-09-01 ENCOUNTER — Other Ambulatory Visit: Payer: Self-pay

## 2020-09-01 DIAGNOSIS — M79675 Pain in left toe(s): Secondary | ICD-10-CM | POA: Diagnosis not present

## 2020-09-01 DIAGNOSIS — M79674 Pain in right toe(s): Secondary | ICD-10-CM | POA: Diagnosis not present

## 2020-09-01 DIAGNOSIS — B351 Tinea unguium: Secondary | ICD-10-CM | POA: Diagnosis not present

## 2020-09-01 DIAGNOSIS — L84 Corns and callosities: Secondary | ICD-10-CM | POA: Diagnosis not present

## 2020-09-01 DIAGNOSIS — I739 Peripheral vascular disease, unspecified: Secondary | ICD-10-CM

## 2020-09-08 NOTE — Progress Notes (Signed)
  Subjective:  Patient ID: Monique Young, female    DOB: 1931-03-07,  MRN: [id number]  87 y.o. female presents for at risk foot care. Patient has h/o PAD and callus(es) left foot and painful thick toenails that are difficult to trim. Painful toenails interfere with ambulation. Aggravating factors include wearing enclosed shoe gear. Pain is relieved with periodic professional debridement. Painful calluses are aggravated when weightbearing with and without shoegear. Pain is relieved with periodic professional debridement.  Patient notes painful right 3rd digit on today's visit. Denies any trauma, redness, drainage or swelling. Tender when wearing enclosed shoe gear. She has attempted no treatment.  PCP is Dr. Bernerd Limbo. Last visit was 04/19/2020.   Allergies  Allergen Reactions   Iodine    Sulfa Antibiotics    Sulfonamide Derivatives     Review of Systems: Negative except as noted in the HPI.   Objective:   Constitutional Pt is a pleasant 87 y.o. African American female in NAD. AAO x 3.   Vascular Capillary refill time to digits <4 seconds b/l lower extremities. Faintly palpable DP pulse(s) b/l lower extremities. Nonpalpable PT pulse(s) b/l lower extremities. Pedal hair absent. Lower extremity skin temperature gradient within normal limits. No pain with calf compression b/l.  Neurologic Protective sensation intact 5/5 intact bilaterally with 10g monofilament b/l. Vibratory sensation intact b/l.  Dermatologic Pedal skin with normal turgor, texture and tone bilaterally. No open wounds bilaterally. No interdigital macerations bilaterally. Toenails 1-5 b/l elongated, discolored, dystrophic, thickened, crumbly with subungual debris and tenderness to dorsal palpation. Hyperkeratotic lesion(s) R 3rd toe, sub 5th met base left foot, and posterior aspect of heel b/l feet.  No erythema, no edema, no drainage, no fluctuance.  Orthopedic: Normal muscle strength 5/5 to all lower extremity muscle groups  bilaterally. No pain crepitus or joint limitation noted with ROM b/l. Hammertoe(s) noted to the 2-5 bilaterally.   Radiographs: None Assessment:   1. Pain due to onychomycosis of toenails of both feet   2. Corns and callosities   3. PVD (peripheral vascular disease) ([place])    Plan:  Patient was evaluated and treated and all questions answered.  Onychomycosis with pain -Nails palliatively debridement as below. -Educated on self-care  Procedure: Nail Debridement Rationale: Pain Type of Debridement: manual, sharp debridement. Instrumentation: Nail nipper, rotary burr. Number of Nails: 10  -Examined patient. -Patient instructed to continue pressure precautions of bilateral heels. Continue heel protectors daily when in bed/recliner. -Patient to continue soft, supportive shoe gear daily. -Toenails 1-5 b/l were debrided in length and girth with sterile nail nippers and dremel without iatrogenic bleeding.  -Corn(s) R 3rd toe pared utilizing sterile scalpel blade without complication or incident. Total number debrided=1. -Callus(es) sub 5th met base left foot and posterior aspect of heel b/l feet pared utilizing sterile scalpel blade without complication or incident. Total number debrided =3. -Patient to report any pedal injuries to medical professional immediately. -Patient/POA to call should there be question/concern in the interim.  Return in about 3 months (around 12/02/2020).  Marzetta Board, DPM

## 2020-12-08 ENCOUNTER — Other Ambulatory Visit: Payer: Self-pay

## 2020-12-08 ENCOUNTER — Encounter: Payer: Self-pay | Admitting: Podiatry

## 2020-12-08 ENCOUNTER — Ambulatory Visit (INDEPENDENT_AMBULATORY_CARE_PROVIDER_SITE_OTHER): Payer: Medicare Other | Admitting: Podiatry

## 2020-12-08 DIAGNOSIS — B351 Tinea unguium: Secondary | ICD-10-CM

## 2020-12-08 DIAGNOSIS — I739 Peripheral vascular disease, unspecified: Secondary | ICD-10-CM | POA: Diagnosis not present

## 2020-12-08 DIAGNOSIS — L84 Corns and callosities: Secondary | ICD-10-CM | POA: Diagnosis not present

## 2020-12-08 DIAGNOSIS — M79675 Pain in left toe(s): Secondary | ICD-10-CM

## 2020-12-08 DIAGNOSIS — M79674 Pain in right toe(s): Secondary | ICD-10-CM | POA: Diagnosis not present

## 2020-12-14 NOTE — Progress Notes (Signed)
  Subjective:  Patient ID: Monique Young, female    DOB: 1930-07-15,  MRN: [id number]  87 y.o. female presents with for at risk foot care. Patient has h/o PAD and corn(s) right 3rd toe , callus(es) of both feet and painful mycotic nails.  Pain interferes with ambulation. Aggravating factors include wearing enclosed shoe gear. Painful toenails interfere with ambulation. Aggravating factors include wearing enclosed shoe gear. Pain is relieved with periodic professional debridement. Painful corns and calluses are aggravated when weightbearing with and without shoegear. Pain is relieved with periodic professional debridement..    She is requesting a toe separator for her right 2nd/3rd toes.  PCP: Bernerd Limbo, MD and last visit was: 06/07/2020.  Review of Systems: Negative except as noted in the HPI.   Allergies  Allergen Reactions   Iodine    Sulfa Antibiotics    Sulfonamide Derivatives     Objective:  There were no vitals filed for this visit. Constitutional Patient is a pleasant 87 y.o. African American female in NAD. AAO x 3.  Vascular Capillary fill time to digits <4 seconds b/l lower extremities. Faintly palpable DP pulse(s) b/l lower extremities. Nonpalpable PT pulse(s) b/l lower extremities. Pedal hair absent. Lower extremity skin temperature gradient within normal limits. No pain with calf compression b/l. No cyanosis or clubbing noted.   Neurologic Normal speech. Protective sensation intact 5/5 intact bilaterally with 10g monofilament b/l. Vibratory sensation intact b/l.  Dermatologic Pedal skin is warm and supple. No open wounds b/l lower extremities. No interdigital macerations b/l lower extremities. Toenails 1-5 b/l elongated, discolored, dystrophic, thickened, crumbly with subungual debris and tenderness to dorsal palpation. Hyperkeratotic lesion(s) R 3rd toe, sub 5th met base left foot, and posterior aspect of heel b/l feet.  No erythema, no edema, no drainage, no fluctuance.   Orthopedic: Normal muscle strength 5/5 to all lower extremity muscle groups bilaterally. Hammertoe(s) noted to the 2-5 bilaterally.    Assessment:   1. Pain due to onychomycosis of toenails of both feet   2. Corns and callosities   3. PVD (peripheral vascular disease) ([place])    Plan:  Patient was evaluated and treated and all questions answered. Consent given for treatment as described below: -Examined patient. -Patient to continue soft, supportive shoe gear daily. -Toenails 1-5 b/l were debrided in length and girth with sterile nail nippers and dremel without iatrogenic bleeding.  -Corn(s) R 3rd toe and callus(es) sub 5th met base left foot and posterior aspect of heel b/l feet were pared utilizing sterile scalpel blade without incident. Total number debrided =4. -Patient to report any pedal injuries to medical professional immediately. -Dispensed toe separator for right 2nd/3rd digits. Apply every morning. Remove every evening. -Patient/POA to call should there be question/concern in the interim.  Return in about 3 months (around 03/09/2021).  Marzetta Board, DPM

## 2021-03-11 ENCOUNTER — Encounter: Payer: Self-pay | Admitting: Podiatry

## 2021-03-11 ENCOUNTER — Ambulatory Visit (INDEPENDENT_AMBULATORY_CARE_PROVIDER_SITE_OTHER): Payer: Medicare Other | Admitting: Podiatry

## 2021-03-11 ENCOUNTER — Other Ambulatory Visit: Payer: Self-pay

## 2021-03-11 DIAGNOSIS — B351 Tinea unguium: Secondary | ICD-10-CM | POA: Diagnosis not present

## 2021-03-11 DIAGNOSIS — I739 Peripheral vascular disease, unspecified: Secondary | ICD-10-CM | POA: Diagnosis not present

## 2021-03-11 DIAGNOSIS — L84 Corns and callosities: Secondary | ICD-10-CM

## 2021-03-11 DIAGNOSIS — M79675 Pain in left toe(s): Secondary | ICD-10-CM

## 2021-03-11 DIAGNOSIS — M79674 Pain in right toe(s): Secondary | ICD-10-CM | POA: Diagnosis not present

## 2021-03-17 NOTE — Progress Notes (Signed)
Subjective:  Patient ID: Monique Young, female    DOB: September 27, 1930,  MRN: [id number]  87 y.o. female presents with for at risk foot care. Patient has h/o PAD and corn(s) right foot , callus(es) b/l and painful mycotic nails.  Pain interferes with ambulation. Aggravating factors include wearing enclosed shoe gear. Painful toenails interfere with ambulation. Aggravating factors include wearing enclosed shoe gear. Pain is relieved with periodic professional debridement. Painful corns and calluses are aggravated when weightbearing with and without shoegear. Pain is relieved with periodic professional debridement.Marland Kitchen    PCP: Bernerd Limbo, MD and last visit was: 12/21/2020.  Review of Systems: Negative except as noted in the HPI.   Allergies  Allergen Reactions   Iodine    Sulfa Antibiotics    Sulfonamide Derivatives     Objective:  There were no vitals filed for this visit. Constitutional Patient is a pleasant 87 y.o. African American female in NAD. AAO x 3.  Vascular Capillary fill time to digits <4 seconds b/l lower extremities. Faintly palpable DP pulse(s) b/l lower extremities. Nonpalpable PT pulse(s) b/l lower extremities. Pedal hair absent. Lower extremity skin temperature gradient within normal limits. No pain with calf compression b/l. No cyanosis or clubbing noted.   Neurologic Normal speech. Protective sensation intact 5/5 intact bilaterally with 10g monofilament b/l. Vibratory sensation intact b/l.  Dermatologic Pedal skin is thin shiny, atrophic b/l lower extremities. No open wounds b/l lower extremities. No interdigital macerations b/l lower extremities. Toenails 1-5 b/l elongated, discolored, dystrophic, thickened, crumbly with subungual debris and tenderness to dorsal palpation. Hyperkeratotic lesion(s) bilateral heels, R 3rd toe, and sub 5th met base bilateral heels.  No erythema, no edema, no drainage, no fluctuance.  Orthopedic: Normal muscle strength 5/5 to all lower extremity  muscle groups bilaterally. Muscle strength 5/5 to all lower extremity muscle groups bilaterally. Hammertoe deformity noted 2-5 b/l.    Assessment:   1. Pain due to onychomycosis of toenails of both feet   2. Corns and callosities   3. PVD (peripheral vascular disease) ([place])    Plan:  Patient was evaluated and treated and all questions answered. Consent given for treatment as described below: -No new findings. No new orders. -Mycotic toenails 1-5 bilaterally were debrided in length and girth with sterile nail nippers and dremel without incident. -Corn(s) R 3rd toe and callus(es) bilateral heels and sub 5th met base left foot were pared utilizing sterile scalpel blade without incident. Total number debrided =4. -Patient/POA to call should there be question/concern in the interim.  Return in about 3 months (around 06/09/2021).  Marzetta Board, DPM

## 2021-06-15 ENCOUNTER — Ambulatory Visit: Payer: Medicare Other | Admitting: Podiatry
